# Patient Record
Sex: Female | Born: 1945 | Race: White | Hispanic: No | Marital: Married | Smoking: Former smoker
Health system: Southern US, Community
[De-identification: ages and names within clinical notes are randomized; demographics above are authoritative.]

## PROBLEM LIST (undated history)

## (undated) DIAGNOSIS — E785 Hyperlipidemia, unspecified: Secondary | ICD-10-CM

## (undated) DIAGNOSIS — C3490 Malignant neoplasm of unspecified part of unspecified bronchus or lung: Secondary | ICD-10-CM

## (undated) DIAGNOSIS — C801 Malignant (primary) neoplasm, unspecified: Secondary | ICD-10-CM

## (undated) DIAGNOSIS — M810 Age-related osteoporosis without current pathological fracture: Secondary | ICD-10-CM

## (undated) DIAGNOSIS — T7840XA Allergy, unspecified, initial encounter: Secondary | ICD-10-CM

## (undated) DIAGNOSIS — Z9981 Dependence on supplemental oxygen: Secondary | ICD-10-CM

## (undated) DIAGNOSIS — M40209 Unspecified kyphosis, site unspecified: Secondary | ICD-10-CM

## (undated) DIAGNOSIS — J449 Chronic obstructive pulmonary disease, unspecified: Secondary | ICD-10-CM

## (undated) DIAGNOSIS — E079 Disorder of thyroid, unspecified: Secondary | ICD-10-CM

## (undated) DIAGNOSIS — Z972 Presence of dental prosthetic device (complete) (partial): Secondary | ICD-10-CM

## (undated) DIAGNOSIS — Z923 Personal history of irradiation: Secondary | ICD-10-CM

## (undated) DIAGNOSIS — H9190 Unspecified hearing loss, unspecified ear: Secondary | ICD-10-CM

## (undated) DIAGNOSIS — N189 Chronic kidney disease, unspecified: Secondary | ICD-10-CM

## (undated) HISTORY — DX: Chronic obstructive pulmonary disease, unspecified: J44.9

## (undated) HISTORY — PX: SPINE SURGERY: SHX786

## (undated) HISTORY — PX: ABDOMINAL HYSTERECTOMY: SHX81

## (undated) HISTORY — PX: TONSILLECTOMY AND ADENOIDECTOMY: SUR1326

## (undated) HISTORY — DX: Allergy, unspecified, initial encounter: T78.40XA

## (undated) HISTORY — DX: Hyperlipidemia, unspecified: E78.5

## (undated) HISTORY — DX: Malignant (primary) neoplasm, unspecified: C80.1

## (undated) HISTORY — DX: Chronic kidney disease, unspecified: N18.9

## (undated) HISTORY — PX: APPENDECTOMY: SHX54

## (undated) HISTORY — PX: ROTATOR CUFF REPAIR: SHX139

## (undated) HISTORY — DX: Disorder of thyroid, unspecified: E07.9

## (undated) HISTORY — DX: Age-related osteoporosis without current pathological fracture: M81.0

## (undated) HISTORY — DX: Personal history of irradiation: Z92.3

## (undated) HISTORY — PX: KIDNEY STONE SURGERY: SHX686

## (undated) NOTE — *Deleted (*Deleted)
Jenny Howe presents today for follow-up of radiation (SBRT) to her right lung completed on 11/15/2018. She had chest CT earlier today and will be called by Dr. Roselind Messier with the results tomorrow  Pain: Fatigue: Appetite: *insert 3 weights* SOB: Cough: Other issues of note: F/U with pulmonologist?  *insert vitals*

---

## 1989-01-23 HISTORY — PX: NECK SURGERY: SHX720

## 2012-10-23 DIAGNOSIS — C3490 Malignant neoplasm of unspecified part of unspecified bronchus or lung: Secondary | ICD-10-CM

## 2012-10-23 HISTORY — DX: Malignant neoplasm of unspecified part of unspecified bronchus or lung: C34.90

## 2012-11-17 DIAGNOSIS — C801 Malignant (primary) neoplasm, unspecified: Secondary | ICD-10-CM

## 2012-11-17 HISTORY — DX: Malignant (primary) neoplasm, unspecified: C80.1

## 2012-11-17 HISTORY — PX: OTHER SURGICAL HISTORY: SHX169

## 2012-12-20 ENCOUNTER — Encounter: Payer: Self-pay | Admitting: *Deleted

## 2012-12-20 DIAGNOSIS — E079 Disorder of thyroid, unspecified: Secondary | ICD-10-CM | POA: Insufficient documentation

## 2012-12-20 DIAGNOSIS — N189 Chronic kidney disease, unspecified: Secondary | ICD-10-CM | POA: Insufficient documentation

## 2012-12-20 DIAGNOSIS — M810 Age-related osteoporosis without current pathological fracture: Secondary | ICD-10-CM | POA: Insufficient documentation

## 2012-12-20 DIAGNOSIS — J449 Chronic obstructive pulmonary disease, unspecified: Secondary | ICD-10-CM | POA: Insufficient documentation

## 2012-12-20 DIAGNOSIS — E785 Hyperlipidemia, unspecified: Secondary | ICD-10-CM | POA: Insufficient documentation

## 2012-12-20 DIAGNOSIS — T7840XA Allergy, unspecified, initial encounter: Secondary | ICD-10-CM | POA: Insufficient documentation

## 2012-12-21 ENCOUNTER — Encounter: Payer: Self-pay | Admitting: Surgery

## 2012-12-21 ENCOUNTER — Institutional Professional Consult (permissible substitution) (INDEPENDENT_AMBULATORY_CARE_PROVIDER_SITE_OTHER): Payer: Self-pay | Admitting: Surgery

## 2012-12-21 VITALS — BP 140/65 | HR 78 | Resp 20 | Ht 63.0 in | Wt 128.0 lb

## 2012-12-21 DIAGNOSIS — R911 Solitary pulmonary nodule: Secondary | ICD-10-CM

## 2012-12-24 ENCOUNTER — Encounter: Payer: Self-pay | Admitting: Surgery

## 2012-12-24 NOTE — Progress Notes (Signed)
[address]       [place] [address]             [phone]        PCP is CAMPBELL, Mila Homer., MD Referring Provider is Weston Settle, MD  Chief Complaint  Patient presents with  . Lung Lesion    Surgical eval on rt upper lobe lung nodule, Chest CT 10/20/12, PFT's 12/12/12    HPI:  The patient is a 67 year old woman with a long smoking history and severe emphysema who has been on oxygen at home for the past 6 years. She said that about 1/2-2 years ago she fell injuring her back and breaking some ribs and a CT scan the chest at that time on 04/29/2011 showed a small ill-defined right upper lobe lung density. She subsequently underwent a PET scan on 05/12/2011 which showed new bilateral lung densities that were hypermetabolic. It was felt that these may be inflammatory or infectious since they were ill-defined and progressed from one to multiple rapidly. She then underwent a repeat PET scan on 08/04/2011 that showed persistence of the irregular nodular density in the right upper lobe measuring 2.6 x 0.8 cm. It appeared slightly more solid than on the previous CT scan. The lesions on the left resolved. The SUV max at that time was 3.1. It was felt that this may be a resolving infectious or inflammatory process although bronchogenic carcinoma could not be ruled out. She underwent a followup CT scan the chest on 10/20/2012 which showed further progression of the right upper lobe irregular masslike opacity measuring 29 x 18 mm. 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 A CT scan of the brain was negative.  Past Medical History  Diagnosis Date  . COPD (chronic obstructive pulmonary disease)   . Osteoporosis   . Allergy     SEASONAL  . Thyroid disease     HYPOTHYROID  . Hyperlipidemia   . Chronic kidney disease     NEPHROLITHIASIS  . Cancer 11/17/12    RULobe.Marland KitchenMarland KitchenCT GUIDED BX     Past Surgical History  Procedure Laterality Date  . Kidney stone surgery      X 5 TO REMOVE STONES  . Rotator cuff repair      TWO RIGHT  . Neck surgery    . Spine surgery    . Abdominal hysterectomy    . Appendectomy    . Tonsillectomy and adenoidectomy    . Ct guided bx  11/17/12    RIGHT UPPER LOBE    Family History  Problem Relation Age of Onset  . Alzheimer's disease Mother   . Cancer Father     MESOTHELIOMA AND SUBDURAL HEMATOMAS  . Hypertension Sister   . Diabetes Sister   . Hypertension Sister   . Diabetes Sister     Social History History  Substance Use Topics  . Smoking status: Former Smoker -- 1.00 packs/day for 45 years    Types: Cigarettes   Quit smoking a few years ago.  . Smokeless tobacco: Not on file     Comment: CURRENTLY USES E-CIGS  . Alcohol Use: Not on file    Current Outpatient Prescriptions  Medication Sig Dispense Refill  . albuterol (PROVENTIL HFA;VENTOLIN HFA) 108 (90 BASE) MCG/ACT inhaler Inhale 2 puffs into the lungs daily as needed for wheezing.      Marland Kitchen albuterol (PROVENTIL) (2.5  MG/3ML) 0.083% nebulizer solution Take 2.5 mg by nebulization every 6 (six) hours as needed for wheezing.      Marland Kitchen atorvastatin (LIPITOR) 40 MG tablet Take 40 mg by mouth daily.      Marland Kitchen azelastine (ASTELIN) 137 MCG/SPRAY nasal spray Place 2 sprays into the nose daily. Use in each nostril as directed      . celecoxib (CELEBREX) 200 MG capsule Take 200 mg by mouth daily.      . cetirizine (ZYRTEC) 10 MG tablet Take 10 mg by mouth daily.      . Cholecalciferol (VITAMIN D) 2000 UNITS CAPS Take by mouth daily.      . diclofenac (FLECTOR) 1.3 % PTCH Place 1 patch onto the skin 2 (two) times daily.      Marland Kitchen gabapentin (NEURONTIN) 600 MG tablet Take 600 mg by mouth 2 (two) times daily.      Boris Lown Oil 500 MG CAPS Take by mouth daily.      Marland Kitchen levothyroxine (SYNTHROID, LEVOTHROID) 112 MCG tablet Take 112 mcg by mouth daily before breakfast.      . meclizine (ANTIVERT) 25 MG  tablet Take 25 mg by mouth 3 (three) times daily.      Marland Kitchen morphine (MSIR) 15 MG tablet Take 15 mg by mouth 2 (two) times daily.      Marland Kitchen oxyCODONE-acetaminophen (PERCOCET) 10-325 MG per tablet Take 1 tablet by mouth every 4 (four) hours as needed for pain.      . roflumilast (DALIRESP) 500 MCG TABS tablet Take 500 mcg by mouth daily.      . vitamin C (ASCORBIC ACID) 500 MG tablet Take 500 mg by mouth daily.      . vitamin E 1000 UNIT capsule Take 1,000 Units by mouth daily.        Home oxygen at 2.5 L for the past several years.  No current facility-administered medications for this visit.    Allergies  Allergen Reactions  . Penicillins Hives, Shortness Of Breath and Itching  . Procaine Hives, Shortness Of Breath and Itching    Novocaine  . Codeine Palpitations    "IRREGULAR HEARTBEAT"  . Ivp Dye (Iodinated Diagnostic Agents) Palpitations    "IRREGULAR HEARTBEAT"  . Povidone-Iodine (Povidone Iodine) Hives    BETADINE OR SOAPS CONTAINING BETADINE    Review of Systems  Constitutional: Positive for activity change, appetite change, fatigue and unexpected weight change. Negative for fever and chills.       Decreased appetite and 15-20 lb wt loss over the last 6-12 months.  HENT: Positive for hearing loss.        Wears dentures  Eyes: Negative.   Respiratory: Positive for cough and shortness of breath.   Cardiovascular: Negative for chest pain, palpitations and leg swelling.  Gastrointestinal: Positive for nausea.  Endocrine: Negative.   Genitourinary:       Kidney stones  Musculoskeletal: Positive for arthralgias. Negative for myalgias and joint swelling.       Lower back and legs  Skin: Negative.   Allergic/Immunologic: Negative.   Neurological: Negative.   Hematological: Negative.   Psychiatric/Behavioral: Negative.     BP 140/65  Pulse 78  Resp 20  Ht 5\' 3"  (1.6 m)  Wt 128 lb (58.06 kg)  BMI 22.68 kg/m2  SpO2 94% Physical Exam  Constitutional: She is oriented to  person, place, and time.  Chronically ill-appearing frail woman who looks older than her stated age. Wearing oxygen.  HENT:  Head: Normocephalic and atraumatic.  Mouth/Throat: Oropharynx is clear and moist.  Eyes: EOM are normal. Pupils are equal, round, and reactive to light.  Neck: No JVD present. No thyromegaly present.  Cardiovascular: Normal rate and regular rhythm.   Murmur heard. Pulmonary/Chest: Effort normal.  Decreased breath sounds throughout  Abdominal: Soft. Bowel sounds are normal. She exhibits no distension and no mass. There is no tenderness.  Musculoskeletal: She exhibits no edema.  kyphosis  Lymphadenopathy:    She has no cervical adenopathy.  Neurological: She is alert and oriented to person, place, and time. She has normal strength. No cranial nerve deficit or sensory deficit.  Skin: Skin is warm and dry.  Psychiatric: She has a normal mood and affect.     Diagnostic Tests:  All prior CT scans and PET scans were done at [facility] or [facility] and were reviewed by me on the [facility] Radiology site.  Pulmonary function testing from 12/12/2012 shows an FEV1 of 0.63 L which is 26% of predicted. There is no significant improvement with bronchodilators. FVC was 1.47 L which was 48% of predicted.   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 This is not amenable to a wedge resection and would require at least a right upper lobectomy and possibly a bilobectomy if this is extending across the fissure into the middle lobe. She has severe COPD with an FEV1 of 0.6 on home oxygen and is not a candidate for operative treatment. She is in overall poor general condition. I would consider referral to radiation oncology for possible radiation therapy. She may be a candidate for SBRT. I reviewed all the scans with the patient and her family and discussed all the above  with them.  Plan:  I'll discuss her with Dr. Melvyn Neth so that he can make referral to radiation oncology.

## 2013-01-04 ENCOUNTER — Encounter: Payer: Self-pay | Admitting: Radiation Oncology

## 2013-01-04 NOTE — Progress Notes (Addendum)
Thoracic Location of Tumor / Histology: RUL lung  Patient presented 24 months ago with symptoms of: CT chest s/p fall  Biopsies of RUL lung mass (if applicable) revealed: squamous cell carcinoma  Tobacco/Marijuana/Snuff/ETOH use: former smoker, 1 PPD x 45 yrs, cigarettes, quit a few yrs ago. Using E cigs at this time.  Past/Anticipated interventions by cardiothoracic surgery, if any: She has a 2.8 x 1.9 cm right upper lobe squamous cell carcinoma that is located along the fissure between the upper and middle lobe. This is not amenable to a wedge resection and would require at least a right upper lobectomy and possibly a bilobectomy if this is extending across the fissure into the middle lobe. She has severe COPD with an FEV1 of 0.6 on home oxygen and is not a candidate for operative treatment.   Past/Anticipated interventions by medical oncology, if any: size of lesion would not require adjuvant chemotherapy, per Dr DeQuincy's note 12/09/12  Signs/Symptoms  Weight changes, if any: decreased appetite, wgt loss 15-20 lbs over past 6-12 mos  Respiratory complaints, if any: cough, SOB, O2 dependent w/COPD @ 2.5L/min, mainly at night, when walking  Hemoptysis, if any: no  Pain issues, if any:  Chronic back pain  SAFETY ISSUES:  Prior radiation? no  Pacemaker/ICD? no  Possible current pregnancy? no  Is the patient on methotrexate? no  Current Complaints / other details:  Married, lives in [address], 3 children, 4 grandchildren, 11 great grandchildren, retired from [employer], [employer] work. Daughter lives next door. Pt c/o back pain which is chronic due to surgery for DDD w/screws, rods. Her pain medications are managed through pain clinic. She has dry cough, SOB w/exertion and uses O2 prn w/exertion and nightly. Pt states appetite increasing.

## 2013-01-05 ENCOUNTER — Encounter: Payer: Self-pay | Admitting: Radiation Oncology

## 2013-01-05 ENCOUNTER — Ambulatory Visit
Admission: RE | Admit: 2013-01-05 | Discharge: 2013-01-05 | Disposition: A | Discharge status: Home / Self Care | Payer: Medicare Other | Source: Ambulatory Visit | Attending: Radiation Oncology | Admitting: Radiation Oncology

## 2013-01-05 DIAGNOSIS — J438 Other emphysema: Secondary | ICD-10-CM | POA: Insufficient documentation

## 2013-01-05 DIAGNOSIS — M810 Age-related osteoporosis without current pathological fracture: Secondary | ICD-10-CM | POA: Insufficient documentation

## 2013-01-05 DIAGNOSIS — C3410 Malignant neoplasm of upper lobe, unspecified bronchus or lung: Secondary | ICD-10-CM | POA: Insufficient documentation

## 2013-01-05 DIAGNOSIS — Z9981 Dependence on supplemental oxygen: Secondary | ICD-10-CM | POA: Insufficient documentation

## 2013-01-05 DIAGNOSIS — Z79899 Other long term (current) drug therapy: Secondary | ICD-10-CM | POA: Insufficient documentation

## 2013-01-05 DIAGNOSIS — Z87891 Personal history of nicotine dependence: Secondary | ICD-10-CM | POA: Insufficient documentation

## 2013-01-05 HISTORY — DX: Malignant neoplasm of unspecified part of unspecified bronchus or lung: C34.90

## 2013-01-05 HISTORY — DX: Unspecified hearing loss, unspecified ear: H91.90

## 2013-01-05 HISTORY — DX: Unspecified kyphosis, site unspecified: M40.209

## 2013-01-05 HISTORY — DX: Dependence on supplemental oxygen: Z99.81

## 2013-01-05 HISTORY — DX: Presence of dental prosthetic device (complete) (partial): Z97.2

## 2013-01-05 NOTE — Progress Notes (Signed)
Pt circled 10/10 on distress screen. Her most distressing problem is financial. Informed pt and daughter that this RN will call SW and infomed them of her score today, and if SW available she will see pt today. Otherwise SW will call pt. Also informed pt that when she has her planning session for radiation treatment she will see financial counselor. Pt verbalized understanding. Pt states she has insurance but "it won't pay everything".  Called Lauren Mullis SW and left vm w/above information.

## 2013-01-05 NOTE — Progress Notes (Signed)
Please see the Nurse Progress Note in the MD Initial Consult Encounter for this patient. 

## 2013-01-05 NOTE — Progress Notes (Signed)
Radiation Oncology         (336) [phone] ________________________________  Initial outpatient Consultation  Name: Jenny Howe MRN: [id number]  Date: 01/05/2013  DOB: 1945/10/28  WU:[id number], Jenny Howe., MD  Weston Settle, MD , Marcellus Scott, MD, Dorris Singh, MD  REFERRING PHYSICIAN: Weston Settle, MD  DIAGNOSIS: Clinical stage I non-small cell lung cancer  HISTORY OF PRESENT ILLNESS::Jenny Howe is a 68 y.o. female who is seen out of the courtesy of Dr. Rennis Harding for an opinion concerning radiation therapy as part of management of patient's recently diagnosed non-small cell lung cancer. The patient presented with an enlarging lesion in the right upper lobe.. A biopsy of this area was performed on 11/17/2012 showing squamous cell carcinoma. The patient's chest CT scan showed no other areas of involvement.  The patient does have a history of severe emphysema and has been oxygen dependent for the past 6 years. She uses her oxygen (2.5 L)with exertion and sleeping. the patient did see Dr. Laneta Simmers for consideration for surgery of this area. The location of the lesion likely require a right upper lobe lobectomy and possibly bilobectomy. With the patient's FEV1 of 0.6 she was not felt to be a candidate for surgical intervention. With this information the patient is now seen in radiation oncology be considered for definitive treatment.  She did undergo a CT scan of the head showing no metastatic disease to this region.    PREVIOUS RADIATION THERAPY: No  PAST MEDICAL HISTORY:  has a past medical history of COPD (chronic obstructive pulmonary disease); Osteoporosis; Allergy; Thyroid disease; Hyperlipidemia; Chronic kidney disease; Cancer (11/17/12); Lung cancer (10/2012); Kyphosis; Oxygen dependent; Hearing loss; Wears dentures; and Osteoporosis.    PAST SURGICAL HISTORY: Past Surgical History  Procedure Laterality Date  . Kidney stone surgery      X 5 TO REMOVE STONES  . Rotator cuff  repair      TWO RIGHT  . Neck surgery  1990's    DDD, replaced disk  . Spine surgery      DDD, screws and rods  . Abdominal hysterectomy    . Appendectomy    . Tonsillectomy and adenoidectomy    . Ct guided bx  11/17/12    RIGHT UPPER LOBE    FAMILY HISTORY: family history includes Alzheimer's disease in her mother; Cancer in her father; Diabetes in her sister and sister; Hypertension in her sister and sister.  SOCIAL HISTORY:  reports that she has quit smoking. Her smoking use included Cigarettes. She has a 45 pack-year smoking history. She does not have any smokeless tobacco history on file. She reports that she does not drink alcohol or use illicit drugs.  ALLERGIES: Penicillins; Procaine; Codeine; Ivp dye; and Povidone-iodine  MEDICATIONS:  Current Outpatient Prescriptions  Medication Sig Dispense Refill  . albuterol (PROVENTIL HFA;VENTOLIN HFA) 108 (90 BASE) MCG/ACT inhaler Inhale 2 puffs into the lungs daily as needed for wheezing.      Marland Kitchen albuterol (PROVENTIL) (2.5 MG/3ML) 0.083% nebulizer solution Take 2.5 mg by nebulization every 6 (six) hours as needed for wheezing.      Marland Kitchen atorvastatin (LIPITOR) 40 MG tablet Take 40 mg by mouth daily.      Marland Kitchen azelastine (ASTELIN) 137 MCG/SPRAY nasal spray Place 2 sprays into the nose daily. Use in each nostril as directed      . celecoxib (CELEBREX) 200 MG capsule Take 200 mg by mouth daily.      . cetirizine (ZYRTEC) 10 MG tablet Take 10  mg by mouth daily.      . Cholecalciferol (VITAMIN D) 2000 UNITS CAPS Take by mouth daily.      . diclofenac (FLECTOR) 1.3 % PTCH Place 1 patch onto the skin 2 (two) times daily.      Marland Kitchen gabapentin (NEURONTIN) 600 MG tablet Take 600 mg by mouth 2 (two) times daily.      Boris Lown Oil 500 MG CAPS Take by mouth daily.      Marland Kitchen levothyroxine (SYNTHROID, LEVOTHROID) 112 MCG tablet Take 112 mcg by mouth daily before breakfast.      . meclizine (ANTIVERT) 25 MG tablet Take 25 mg by mouth 3 (three) times daily.      Marland Kitchen  morphine (MSIR) 15 MG tablet Take 15 mg by mouth 2 (two) times daily.      Marland Kitchen oxyCODONE-acetaminophen (PERCOCET) 10-325 MG per tablet Take 1 tablet by mouth every 4 (four) hours as needed for pain.      . roflumilast (DALIRESP) 500 MCG TABS tablet Take 500 mcg by mouth daily.      . vitamin C (ASCORBIC ACID) 500 MG tablet Take 500 mg by mouth daily.      . vitamin E 1000 UNIT capsule Take 1,000 Units by mouth daily.       No current facility-administered medications for this encounter.    REVIEW OF SYSTEMS:  A 15 point review of systems is documented in the electronic medical record. This was obtained by the nursing staff. However, I reviewed this with the patient to discuss relevant findings and make appropriate changes. She denies any pain in the chest area or significant cough. She denies any hemoptysis. She has dyspnea with exertion. She is able to walk out to her mailbox before becoming too short of breath. She denies any headaches dizziness or blurred vision. She has chronic back pain requiring narcotics.    PHYSICAL EXAM:  height is 5\' 3"  (1.6 m) and weight is 127 lb 11.2 oz (57.924 kg). Her oral temperature is 99.1 F (37.3 C). Her blood pressure is 160/56 and her pulse is 77. Her respiration is 20 and oxygen saturation is 96%.   BP 160/56  Pulse 77  Temp(Src) 99.1 F (37.3 C) (Oral)  Resp 20  Ht 5\' 3"  (1.6 m)  Wt 127 lb 11.2 oz (57.924 kg)  BMI 22.63 kg/m2  SpO2 96%  General Appearance:    Alert, cooperative, no distress, appears stated age,  she is accompanied by 2 of her daughters this afternoon   Head:    Normocephalic, without obvious abnormality, atraumatic  Eyes:    PERRL, conjunctiva/corneas clear, EOM's intact,        Nose:   Nares normal, septum midline, mucosa normal, no drainage    or sinus tenderness  Throat:   Lips, mucosa, and tongue normal; gums normal, dentures in place   Neck:   Supple, symmetrical, trachea midline, no adenopathy;    thyroid:  no  enlargement/tenderness/nodules;      Back:     Symmetric, no curvature, ROM normal, no CVA tenderness  Lungs:     Clear to auscultation bilaterally, respirations unlabored, breath sounds in general distant   Chest Wall:    No tenderness or deformity   Heart:    Regular rate and rhythm,        Abdomen:     Soft, non-tender, bowel sounds active all four quadrants,    no masses, no organomegaly, scar along the right lateral abdominal region from  prior kidney surgery         Extremities:   Extremities normal, atraumatic, no cyanosis or edema, some clubbing of fingernails noted  Pulses:   2+ and symmetric all extremities  Skin:   Skin color, texture, turgor normal, no rashes or lesions  Lymph nodes:   Cervical, supraclavicular, and axillary nodes normal  Neurologic:    normal strength,        KPS = 60-70  100 - Normal; no complaints; no evidence of disease. 90   - Able to carry on normal activity; minor signs or symptoms of disease. 80   - Normal activity with effort; some signs or symptoms of disease. 65   - Cares for self; unable to carry on normal activity or to do active work. 60   - Requires occasional assistance, but is able to care for most of his personal needs. 50   - Requires considerable assistance and frequent medical care. 40   - Disabled; requires special care and assistance. 30   - Severely disabled; hospital admission is indicated although death not imminent. 20   - Very sick; hospital admission necessary; active supportive treatment necessary. 10   - Moribund; fatal processes progressing rapidly. 0     - Dead  Karnofsky DA, Abelmann WH, Craver LS and Burchenal JH [phone]) The use of the nitrogen mustards in the palliative treatment of carcinoma: with particular reference to bronchogenic carcinoma Cancer 1 634-56  LABORATORY DATA:  No results found for this basename: WBC, HGB, HCT, MCV, PLT   No results found for this basename: NA, K, CL, CO2   No results found for  this basename: ALT, AST, GGT, ALKPHOS, BILITOT     RADIOGRAPHY:  outside images reviewed     IMPRESSION: Clinical stage I squamous cell carcinoma  of the lung presenting in the right upper lobe region. Patient is not a candidate for surgery in light of her severe emphysema and medical condition.   she would be at good candidate for a definitive course of radiation therapy.  After careful review of the patient's images she would appear to be a candidate for stereotactic body radiotherapy (SBRT).   in terms of her radiation therapy options this would give the best chances for cure while at the same time reducing the amount of scar tissue of her normal lung. I discussed the treatment course side effects and potential toxicities of radiation therapy in this situation with the patient and her family. She does understand that her breathing could potentially worsen after this treatment but she also understands that her breathing  would worsen with an untreated tumor.   she is willing to accept the risks of radiation therapy and proceed with a definitive course of treatment.   PLAN: simulation and planning on 01/10/2013  I spent 60 minutes minutes face to face with the patient and more than 50% of that time was spent in counseling and/or coordination of care.   ------------------------------------------------  -----------------------------------  Billie Lade, PhD, MD

## 2013-01-10 ENCOUNTER — Encounter: Payer: Self-pay | Admitting: Radiation Oncology

## 2013-01-10 ENCOUNTER — Ambulatory Visit
Admission: RE | Admit: 2013-01-10 | Discharge: 2013-01-10 | Disposition: A | Discharge status: Home / Self Care | Payer: Medicare Other | Source: Ambulatory Visit | Attending: Radiation Oncology | Admitting: Radiation Oncology

## 2013-01-10 DIAGNOSIS — Z510 Encounter for antineoplastic radiation therapy: Secondary | ICD-10-CM | POA: Insufficient documentation

## 2013-01-10 DIAGNOSIS — C3490 Malignant neoplasm of unspecified part of unspecified bronchus or lung: Secondary | ICD-10-CM | POA: Insufficient documentation

## 2013-01-10 DIAGNOSIS — J3489 Other specified disorders of nose and nasal sinuses: Secondary | ICD-10-CM | POA: Insufficient documentation

## 2013-01-10 NOTE — Progress Notes (Signed)
  Radiation Oncology         [phone]) [phone] ________________________________  Name: Jenny Howe MRN: [id number]  Date: 01/10/2013  DOB: [birth year]  RESPIRATORY MOTION MANAGEMENT SIMULATION  NARRATIVE:  In order to account for effect of respiratory motion on target structures and other organs in the planning and delivery of radiotherapy, this patient underwent respiratory motion management simulation.  To accomplish this, when the patient was brought to the CT simulation planning suite, 4D respiratoy motion management CT images were obtained.  The CT images were loaded into the planning software.  Then, using a variety of tools including Cine, MIP, and standard views, the target volume and planning target volumes (PTV) were delineated.  Avoidance structures were contoured.  Treatment planning then occurred.  Dose volume histograms were generated and reviewed for each of the requested structure.  The resulting plan was carefully reviewed and approved today.  -----------------------------------  Billie Lade, PhD, MD

## 2013-01-10 NOTE — Progress Notes (Signed)
Patient had questions about insurance and ability to pay.  Gave her information regarding [employer].  Patient has applied for MCD and has bills to give them for deductible.

## 2013-01-10 NOTE — Progress Notes (Signed)
  Radiation Oncology         ([phone]) [phone] ________________________________  Name: Jenny Howe MRN: [id number]  Date: 01/10/2013  DOB: [birth year]  SIMULATION AND TREATMENT PLANNING NOTE  DIAGNOSIS: Clinical stage I non-small cell lung cancer  NARRATIVE:  The patient was brought to the CT Simulation planning suite.  Identity was confirmed.  All relevant records and images related to the planned course of therapy were reviewed.  The patient freely provided informed written consent to proceed with treatment after reviewing the details related to the planned course of therapy. The consent form was witnessed and verified by the simulation staff.  Then, the patient was set-up in a stable reproducible  supine position for radiation therapy.  CT images were obtained.  Surface markings were placed.  The CT images were loaded into the planning software.  Then the target and avoidance structures were contoured.  Treatment planning then occurred.  The radiation prescription was entered and confirmed.  Then, I designed and supervised the construction of a total of 2 medically necessary complex treatment devices.  I have requested : This treatment constitutes a Special Treatment Procedure for the following reason: [ High dose per fraction requiring special monitoring for increased toxicities of treatment including daily imaging..  I have ordered: 3-D simulation with planning for stereotactic body radiation therapy.  PLAN:  The patient will receive 54 Gy in 3 fractions.  ________________________________  -----------------------------------  Billie Lade, PhD, MD

## 2013-01-25 ENCOUNTER — Ambulatory Visit
Admission: RE | Admit: 2013-01-25 | Discharge: 2013-01-25 | Disposition: A | Discharge status: Home / Self Care | Payer: Medicare Other | Source: Ambulatory Visit | Attending: Radiation Oncology | Admitting: Radiation Oncology

## 2013-01-25 ENCOUNTER — Encounter: Payer: Self-pay | Admitting: Radiation Oncology

## 2013-01-25 NOTE — Progress Notes (Signed)
  Radiation Oncology         [phone] ________________________________  Name: Jenny Howe MRN: [id number]  Date: 01/25/2013  DOB: Sep 14, 1945  Stereotactic Body Radiotherapy Treatment Procedure Note #1   NARRATIVE:  Jenny Howe was brought to the stereotactic radiation treatment machine and placed supine on the CT couch. The patient was set up for stereotactic body radiotherapy on the body fix pillow.  3D TREATMENT PLANNING AND DOSIMETRY:  The patient's radiation plan was reviewed and approved prior to starting treatment.  It showed 3-dimensional radiation distributions overlaid onto the planning CT.  The [facility] for the target structures as well as the organs at risk were reviewed. The documentation of this is filed in the radiation oncology EMR.  SIMULATION VERIFICATION:  The patient underwent CT imaging on the treatment unit.  These were carefully aligned to document that the ablative radiation dose would cover the target volume and maximally spare the nearby organs at risk according to the planned distribution.  SPECIAL TREATMENT PROCEDURE: Jenny Howe received high dose ablative stereotactic body radiotherapy to the planned target volume without unforeseen complications. Treatment was delivered uneventfully. The high doses associated with stereotactic body radiotherapy and the significant potential risks require careful treatment set up and patient monitoring constituting a special treatment procedure   STEREOTACTIC TREATMENT MANAGEMENT:  Following delivery, the patient was evaluated clinically. The patient tolerated treatment without significant acute effects, and was discharged to home in stable condition.    PLAN: Continue treatment as planned.  ________________________________  Billie Lade, PhD, MD

## 2013-01-25 NOTE — Progress Notes (Signed)
[facility]    Radiation Oncology [id number] [place]     Maryln Gottron, M.D. [place], [place] [address]               Billie Lade, M.D., Ph.D. Phone: [phone]      Molli Hazard A. Kathrynn Running, M.D. Fax: [phone]      Radene Gunning, M.D., Ph.D.         Lurline Hare, M.D.         Grayland Jack, M.D Weekly Treatment Management Note  Name: Jenny Howe     MRN: [id number]        CSN: [id number] Date: 01/25/2013      DOB: [birth year]  CC: Wilmer Floor., MD         Orvan Falconer    Status: Outpatient  Diagnosis: The encounter diagnosis was Lung cancer, right.  Current Dose: 18 Gy  Current Fraction: 1  Planned Dose: 54 Gy  Narrative: Jenny Howe was seen today for weekly treatment management. The chart was checked and CBCT  were reviewed. sHe is tolerating her SBRT well at this time without any side effects. She has had sinus congestion and will see her primary care physician concerning this issue.  Penicillins; Procaine; Codeine; Ivp dye; and Povidone-iodine  Current Outpatient Prescriptions  Medication Sig Dispense Refill  . albuterol (PROVENTIL HFA;VENTOLIN HFA) 108 (90 BASE) MCG/ACT inhaler Inhale 2 puffs into the lungs daily as needed for wheezing.      Marland Kitchen albuterol (PROVENTIL) (2.5 MG/3ML) 0.083% nebulizer solution Take 2.5 mg by nebulization every 6 (six) hours as needed for wheezing.      Marland Kitchen atorvastatin (LIPITOR) 40 MG tablet Take 40 mg by mouth daily.      Marland Kitchen azelastine (ASTELIN) 137 MCG/SPRAY nasal spray Place 2 sprays into the nose daily. Use in each nostril as directed      . celecoxib (CELEBREX) 200 MG capsule Take 200 mg by mouth daily.      . cetirizine (ZYRTEC) 10 MG tablet Take 10 mg by mouth daily.      . Cholecalciferol (VITAMIN D) 2000 UNITS CAPS Take by mouth daily.      . diclofenac (FLECTOR) 1.3 % PTCH Place 1 patch onto the skin 2 (two) times daily.      Marland Kitchen gabapentin (NEURONTIN) 600 MG tablet Take 600 mg by mouth 2 (two) times daily.       Boris Lown Oil 500 MG CAPS Take by mouth daily.      Marland Kitchen levothyroxine (SYNTHROID, LEVOTHROID) 112 MCG tablet Take 112 mcg by mouth daily before breakfast.      . meclizine (ANTIVERT) 25 MG tablet Take 25 mg by mouth 3 (three) times daily.      Marland Kitchen morphine (MSIR) 15 MG tablet Take 15 mg by mouth 2 (two) times daily.      Marland Kitchen oxyCODONE-acetaminophen (PERCOCET) 10-325 MG per tablet Take 1 tablet by mouth every 4 (four) hours as needed for pain.      . roflumilast (DALIRESP) 500 MCG TABS tablet Take 500 mcg by mouth daily.      . vitamin C (ASCORBIC ACID) 500 MG tablet Take 500 mg by mouth daily.      . vitamin E 1000 UNIT capsule Take 1,000 Units by mouth daily.       No current facility-administered medications for this encounter.   Labs:  No results found for this basename: WBC, HGB, HCT, MCV, PLT   No results found for this  basename: CREATININE, BUN, NA, K, CL, CO2   No results found for this basename: ALT, AST, GGT, ALK, PHOS, BILITOT    Physical Examination:  weight is 127 lb 11.2 oz (57.924 kg). Her oral temperature is 98.6 F (37 C). Her blood pressure is 138/55 and her pulse is 76. Her respiration is 20 and oxygen saturation is 95%.    Wt Readings from Last 3 Encounters:  01/25/13 127 lb 11.2 oz (57.924 kg)  01/05/13 127 lb 11.2 oz (57.924 kg)  12/21/12 128 lb (58.06 kg)     Lungs - Normal respiratory effort, chest expands symmetrically. Lungs are clear to auscultation, no crackles or wheezes.  Heart has regular rhythm and rate  Abdomen is soft and non tender with normal bowel sounds  Assessment:  Patient tolerating treatments well  Plan: Continue treatment per original radiation prescription

## 2013-01-25 NOTE — Progress Notes (Signed)
Pt c/o back pain which is chronic. Pt denies other pain today, has dry cough, SOB w/minimal exertion. She uses O2 nightly and prn. Pt c/o "sinus infection" x 2 weeks. She is taking OTC Zyrtec daily. She states she has headache, sinus pressure, fullness in her forehead, above her nose and under her eyes. She states her PCP usually gives her antibiotic. Pt will discuss w/dr today. Pt fatigued.

## 2013-01-27 ENCOUNTER — Ambulatory Visit: Payer: Medicare Other | Admitting: Radiation Oncology

## 2013-01-30 ENCOUNTER — Ambulatory Visit
Admission: RE | Admit: 2013-01-30 | Discharge: 2013-01-30 | Disposition: A | Discharge status: Home / Self Care | Payer: Medicare Other | Source: Ambulatory Visit | Attending: Radiation Oncology | Admitting: Radiation Oncology

## 2013-01-30 NOTE — Progress Notes (Signed)
  Radiation Oncology         [phone] ________________________________  Name: Jenny Howe MRN: [id number]  Date: 01/30/2013  DOB: 01-12-1946  Stereotactic Body Radiotherapy Treatment Procedure Note #2  NARRATIVE:  Jenny Howe was brought to the stereotactic radiation treatment machine and placed supine on the CT couch. The patient was set up for stereotactic body radiotherapy on the body fix pillow.  3D TREATMENT PLANNING AND DOSIMETRY:  The patient's radiation plan was reviewed and approved prior to starting treatment.  It showed 3-dimensional radiation distributions overlaid onto the planning CT.  The [facility] for the target structures as well as the organs at risk were reviewed. The documentation of this is filed in the radiation oncology EMR.  SIMULATION VERIFICATION:  The patient underwent CT imaging on the treatment unit.  These were carefully aligned to document that the ablative radiation dose would cover the target volume and maximally spare the nearby organs at risk according to the planned distribution.  SPECIAL TREATMENT PROCEDURE: Jenny Howe received high dose ablative stereotactic body radiotherapy to the planned target volume without unforeseen complications. Treatment was delivered uneventfully. The high doses associated with stereotactic body radiotherapy and the significant potential risks require careful treatment set up and patient monitoring constituting a special treatment procedure   STEREOTACTIC TREATMENT MANAGEMENT:  Following delivery, the patient was evaluated clinically. The patient tolerated treatment without significant acute effects, and was discharged to home in stable condition.    PLAN: Continue treatment as planned.  ________________________________  Billie Lade, PhD, MD

## 2013-02-01 ENCOUNTER — Ambulatory Visit: Payer: Medicare Other | Admitting: Radiation Oncology

## 2013-02-02 ENCOUNTER — Ambulatory Visit
Admission: RE | Admit: 2013-02-02 | Discharge: 2013-02-02 | Disposition: A | Discharge status: Home / Self Care | Payer: Medicare Other | Source: Ambulatory Visit | Attending: Radiation Oncology | Admitting: Radiation Oncology

## 2013-02-02 NOTE — Progress Notes (Signed)
  Radiation Oncology         [phone] ________________________________  Name: Jenny Howe MRN: [id number]  Date: 02/02/2013  DOB: [birth year]  Stereotactic Body Radiotherapy Treatment Procedure Note- #3  NARRATIVE:  Jenny Howe was brought to the stereotactic radiation treatment machine and placed supine on the CT couch. The patient was set up for stereotactic body radiotherapy on the body fix pillow.  3D TREATMENT PLANNING AND DOSIMETRY:  The patient's radiation plan was reviewed and approved prior to starting treatment.  It showed 3-dimensional radiation distributions overlaid onto the planning CT.  The [facility] for the target structures as well as the organs at risk were reviewed. The documentation of this is filed in the radiation oncology EMR.  SIMULATION VERIFICATION:  The patient underwent CT imaging on the treatment unit.  These were carefully aligned to document that the ablative radiation dose would cover the target volume and maximally spare the nearby organs at risk according to the planned distribution.  SPECIAL TREATMENT PROCEDURE: Jenny Howe received high dose ablative stereotactic body radiotherapy to the planned target volume without unforeseen complications. Treatment was delivered uneventfully. The high doses associated with stereotactic body radiotherapy and the significant potential risks require careful treatment set up and patient monitoring constituting a special treatment procedure   STEREOTACTIC TREATMENT MANAGEMENT:  Following delivery, the patient was evaluated clinically. The patient tolerated treatment without significant acute effects, and was discharged to home in stable condition.    PLAN: Continue treatment as planned.  ________________________________  Billie Lade, PhD, MD

## 2013-02-03 ENCOUNTER — Ambulatory Visit: Payer: Medicare Other | Admitting: Radiation Oncology

## 2013-02-24 ENCOUNTER — Encounter: Payer: Self-pay | Admitting: Radiation Oncology

## 2013-02-24 NOTE — Progress Notes (Signed)
  Radiation Oncology         [phone] ________________________________  Name: Shaneice Barsanti MRN: [id number]  Date: 02/24/2013  DOB: [birth year]  End of Treatment Note  Diagnosis:   Clinical stage I non-small cell lung cancer     Indication for treatment:  Definitive treatment       Radiation treatment dates:   September 3, September 8, September 11  Site/dose:   Right upper lobe nodule, 54 gray in 3 fractions  Beams/energy:   SBRT techniques  Narrative: The patient tolerated radiation treatment relatively well.   She had some mild fatigue with her therapy.  Plan: The patient has completed radiation treatment. The patient will return to radiation oncology clinic for routine followup in one month. I advised them to call or return sooner if they have any questions or concerns related to their recovery or treatment.  -----------------------------------  Billie Lade, PhD, MD

## 2013-02-28 ENCOUNTER — Encounter: Payer: Self-pay | Admitting: Oncology

## 2013-03-02 ENCOUNTER — Encounter: Payer: Self-pay | Admitting: Radiation Oncology

## 2013-03-02 ENCOUNTER — Ambulatory Visit
Admission: RE | Admit: 2013-03-02 | Discharge: 2013-03-02 | Disposition: A | Discharge status: Home / Self Care | Payer: Medicare Other | Source: Ambulatory Visit | Attending: Radiation Oncology | Admitting: Radiation Oncology

## 2013-03-02 NOTE — Progress Notes (Addendum)
Jenny Howe's here for follow up with her husband and daughter.  She is having pain in her lower back, pelvis and legs.  She said it is sciatica and it rating it at a 8/10.  She is taking morphine and percocet for this.  She says her shortness of breath with activity is a little worse since treatment ended.  Her oxygen saturation today on 2L is 95%.  She denies a cough or hemoptysis.  She was recently on antibiotics and prednisone for a sinus infection and bronchitis.  She finished the meds 2 days ago.  She has a strip of redness on her right side/right chest.  She does have fatigue.

## 2013-03-02 NOTE — Progress Notes (Signed)
Radiation Oncology         [phone]) [phone] ________________________________  Name: Jenny Howe MRN: [id number]  Date: 03/02/2013  DOB: 08/03/1945  Follow-Up Visit Note  CC: CAMPBELL, Mila Homer., MD  Weston Settle, MD  Diagnosis:   Clinical stage I non-small cell lung cancer  Interval Since Last Radiation:  One month ,  the patient completed SBRT  For a early stage I non-small cell lung cancer presenting in the right upper lobe  Narrative:  The patient returns today for routine follow-up.  She has had some fatigue but admits to recently completing antibiotic therapy for sinus infection. Her breathing is stable. She was given steroids for wheezing at the same time as her antibiotic therapy by her pulmonologist.  She denies any significant coughing or hemoptysis or pain in the chest area.                              ALLERGIES:  is allergic to penicillins; procaine; codeine; ivp dye; and povidone-iodine.  Meds: Current Outpatient Prescriptions  Medication Sig Dispense Refill  . albuterol (PROVENTIL HFA;VENTOLIN HFA) 108 (90 BASE) MCG/ACT inhaler Inhale 2 puffs into the lungs daily as needed for wheezing.      Marland Kitchen albuterol (PROVENTIL) (2.5 MG/3ML) 0.083% nebulizer solution Take 2.5 mg by nebulization every 6 (six) hours as needed for wheezing.      Marland Kitchen atorvastatin (LIPITOR) 40 MG tablet Take 40 mg by mouth daily.      Marland Kitchen azelastine (ASTELIN) 137 MCG/SPRAY nasal spray Place 2 sprays into the nose daily. Use in each nostril as directed      . cetirizine (ZYRTEC) 10 MG tablet Take 10 mg by mouth daily.      . Cholecalciferol (VITAMIN D) 2000 UNITS CAPS Take by mouth daily.      Marland Kitchen gabapentin (NEURONTIN) 600 MG tablet Take 600 mg by mouth 2 (two) times daily.      Boris Lown Oil 500 MG CAPS Take by mouth daily.      Marland Kitchen levothyroxine (SYNTHROID, LEVOTHROID) 112 MCG tablet Take 112 mcg by mouth daily before breakfast.      . meclizine (ANTIVERT) 25 MG tablet Take 25 mg by mouth 3 (three) times daily.       Marland Kitchen morphine (MSIR) 15 MG tablet Take 15 mg by mouth 2 (two) times daily.      Marland Kitchen oxyCODONE-acetaminophen (PERCOCET) 10-325 MG per tablet Take 1 tablet by mouth every 4 (four) hours as needed for pain.      . roflumilast (DALIRESP) 500 MCG TABS tablet Take 500 mcg by mouth daily.      . vitamin C (ASCORBIC ACID) 500 MG tablet Take 500 mg by mouth daily.      . vitamin E 1000 UNIT capsule Take 1,000 Units by mouth daily.      . celecoxib (CELEBREX) 200 MG capsule Take 200 mg by mouth daily.      . diclofenac (FLECTOR) 1.3 % PTCH Place 1 patch onto the skin 2 (two) times daily.       No current facility-administered medications for this encounter.    Physical Findings: The patient is in no acute distress. Patient is alert and oriented.  height is 5\' 3"  (1.6 m) and weight is 133 lb 12.8 oz (60.691 kg). Her temperature is 98.8 F (37.1 C). Her blood pressure is 133/58 and her pulse is 93. Her oxygen saturation is 95%. .  The  lungs are clear. The heart has a regular rhythm and rate. The patient has 2 L of supplemental oxygen in place.  Lab Findings: No results found for this basename: WBC, HGB, HCT, MCV, PLT      Radiographic Findings: No results found.  Impression:  Clinically stable status post SBRT for early stage lung cancer  Plan:  Routine followup in 3 months. The patient will be set up for a chest CT scan at that time if not already performed by another provider.  _____________________________________  -----------------------------------  Billie Lade, PhD, MD

## 2013-06-05 ENCOUNTER — Ambulatory Visit
Admission: RE | Admit: 2013-06-05 | Discharge: 2013-06-05 | Disposition: A | Discharge status: Home / Self Care | Payer: Medicare Other | Source: Ambulatory Visit | Attending: Radiation Oncology | Admitting: Radiation Oncology

## 2013-06-05 ENCOUNTER — Telehealth: Payer: Self-pay | Admitting: *Deleted

## 2013-06-05 ENCOUNTER — Encounter: Payer: Self-pay | Admitting: Radiation Oncology

## 2013-06-05 NOTE — Progress Notes (Signed)
Radiation Oncology         [phone]) [phone] ________________________________  Name: Jenny Howe MRN: [id number]  Date: 06/05/2013  DOB: 1946/03/16  Follow-Up Visit Note  CC: CAMPBELL, Estill Bamberg., MD  [name], MD  Diagnosis:   Clinical stage I non-small cell lung cancer presenting in the right upper lobe  Interval Since Last Radiation:  4  months  Narrative:  The patient returns today for routine follow-up.  She has been doing reasonably well up until recently. She was diagnosed with bronchitis late last week when placed on antibiotics for this issue. She is doing better at this time concerning this issue.   she denies any hemoptysis or pain in the chest area. She denies any new bony pain headaches dizziness or blurred vision.  She uses oxygen when ambulating and at night.                            ALLERGIES:  is allergic to penicillins; procaine; codeine; ivp dye; and povidone-iodine.  Meds: Current Outpatient Prescriptions  Medication Sig Dispense Refill  . albuterol (PROVENTIL HFA;VENTOLIN HFA) 108 (90 BASE) MCG/ACT inhaler Inhale 2 puffs into the lungs daily as needed for wheezing.      Marland Kitchen albuterol (PROVENTIL) (2.5 MG/3ML) 0.083% nebulizer solution Take 2.5 mg by nebulization every 6 (six) hours as needed for wheezing.      Marland Kitchen atorvastatin (LIPITOR) 40 MG tablet Take 40 mg by mouth daily.      Marland Kitchen azelastine (ASTELIN) 137 MCG/SPRAY nasal spray Place 2 sprays into the nose daily. Use in each nostril as directed      . cetirizine (ZYRTEC) 10 MG tablet Take 10 mg by mouth daily.      . Cholecalciferol (VITAMIN D) 2000 UNITS CAPS Take by mouth daily.      . diclofenac (FLECTOR) 1.3 % PTCH Place 1 patch onto the skin 2 (two) times daily.      Marland Kitchen gabapentin (NEURONTIN) 600 MG tablet Take 600 mg by mouth 2 (two) times daily.      Javier Docker Oil 500 MG CAPS Take by mouth daily.      Marland Kitchen levofloxacin (LEVAQUIN) 750 MG tablet Take 750 mg by mouth daily. For 10 days      . levothyroxine  (SYNTHROID, LEVOTHROID) 112 MCG tablet Take 112 mcg by mouth daily before breakfast.      . loperamide (IMODIUM) 2 MG capsule Take by mouth as needed for diarrhea or loose stools.      . meclizine (ANTIVERT) 25 MG tablet Take 25 mg by mouth 3 (three) times daily.      Marland Kitchen morphine (MSIR) 15 MG tablet Take 15 mg by mouth 2 (two) times daily.      Marland Kitchen oxyCODONE-acetaminophen (PERCOCET) 10-325 MG per tablet Take 1 tablet by mouth every 4 (four) hours as needed for pain.      . Probiotic Product (SOLUBLE FIBER/PROBIOTICS PO) Take 1 tablet by mouth daily.      . roflumilast (DALIRESP) 500 MCG TABS tablet Take 500 mcg by mouth daily.      . vitamin C (ASCORBIC ACID) 500 MG tablet Take 500 mg by mouth daily.      . vitamin E 1000 UNIT capsule Take 1,000 Units by mouth daily.       No current facility-administered medications for this encounter.    Physical Findings: The patient is in no acute distress. Patient is alert and oriented.  weight is 129 lb 3.2 oz (58.605 kg). Her oral temperature is 98.6 F (37 C). Her blood pressure is 133/63 and her pulse is 98. Her respiration is 22 and oxygen saturation is 94%. .  No palpable supraclavicular or axillary adenopathy. The lungs are clear to auscultation. The heart has a regular rhythm and rate.  Lab Findings: No results found for this basename: WBC, HGB, HCT, MCV, PLT      Radiographic Findings: No results found.  Impression:  Clinically stable    Plan:  She will be scheduled for her first CT scan status post SBRT routine followup in 6 months. The scan will be performed at the [facility] facility.  ____________________________________ Blair Promise, MD

## 2013-06-05 NOTE — Telephone Encounter (Signed)
Called patient to inform of test, lvm for a return call

## 2013-06-05 NOTE — Progress Notes (Signed)
Follow up SBRT right lung, 01/25/14; 01/30/13; 02/02/13. atient 94% room amir, loose cogestive cough, +yellowish phelgm, her humidifier oxygen water went into her lungs, was placed on kezaquin this past Friday for 10 days, rib cage soreness from coughing so much states patient, feels a lot better than Friday, eating good, fatigued,  In w/c, very sob with mild exertion 2:32 PM

## 2013-08-21 ENCOUNTER — Telehealth: Payer: Self-pay | Admitting: *Deleted

## 2013-08-21 NOTE — Telephone Encounter (Signed)
On 08-21-13 fax medical records to [facility], it was consult note, end of tx note, follow up note.

## 2013-12-04 ENCOUNTER — Ambulatory Visit: Payer: Medicare Other | Admitting: Radiation Oncology

## 2013-12-21 ENCOUNTER — Encounter: Payer: Self-pay | Admitting: Radiation Oncology

## 2013-12-21 ENCOUNTER — Ambulatory Visit (HOSPITAL_COMMUNITY)
Admission: RE | Admit: 2013-12-21 | Discharge: 2013-12-21 | Disposition: A | Discharge status: Home / Self Care | Payer: Medicare Other | Source: Ambulatory Visit | Attending: Radiation Oncology | Admitting: Radiation Oncology

## 2013-12-21 ENCOUNTER — Ambulatory Visit
Admission: RE | Admit: 2013-12-21 | Discharge: 2013-12-21 | Disposition: A | Discharge status: Home / Self Care | Payer: Medicare Other | Source: Ambulatory Visit | Attending: Radiation Oncology | Admitting: Radiation Oncology

## 2013-12-21 VITALS — BP 116/64 | HR 91 | Temp 98.2°F | Ht 63.0 in | Wt 130.8 lb

## 2013-12-21 DIAGNOSIS — I700 Atherosclerosis of aorta: Secondary | ICD-10-CM | POA: Diagnosis not present

## 2013-12-21 DIAGNOSIS — IMO0002 Reserved for concepts with insufficient information to code with codable children: Secondary | ICD-10-CM | POA: Diagnosis not present

## 2013-12-21 DIAGNOSIS — R0602 Shortness of breath: Secondary | ICD-10-CM | POA: Insufficient documentation

## 2013-12-21 DIAGNOSIS — J984 Other disorders of lung: Secondary | ICD-10-CM | POA: Diagnosis not present

## 2013-12-21 DIAGNOSIS — C3490 Malignant neoplasm of unspecified part of unspecified bronchus or lung: Secondary | ICD-10-CM | POA: Diagnosis not present

## 2013-12-21 DIAGNOSIS — C3411 Malignant neoplasm of upper lobe, right bronchus or lung: Secondary | ICD-10-CM

## 2013-12-21 DIAGNOSIS — I2510 Atherosclerotic heart disease of native coronary artery without angina pectoris: Secondary | ICD-10-CM | POA: Insufficient documentation

## 2013-12-21 DIAGNOSIS — M959 Acquired deformity of musculoskeletal system, unspecified: Secondary | ICD-10-CM | POA: Diagnosis not present

## 2013-12-21 NOTE — Progress Notes (Signed)
Radiation Oncology         [phone]) [phone] ________________________________  Name: Jenny Howe MRN: [id number]  Date: 12/21/2013  DOB: 1946-04-26  Follow-Up Visit Note  CC: Helen Hashimoto., MD  Helen Hashimoto., MD  Diagnosis:  Clinical stage I non-small cell lung cancer presenting in the right upper lobe    Interval Since Last Radiation:  10  months  Narrative:  The patient returns today for routine follow-up.  Since her last visit the patient has moved to the [facility] area. She was brought up today several family members for appointment. Patient did get lost and is approximately 2 hours late for her appointment. Fortunately our clinic with still open. She continues on supplemental oxygen primarily at night and with exertion. She denies any pain in the chest area, cough or hemoptysis. Patient has run out of her prednisone and Proventil for which she was given a limited prescription. She has established followup care in the [place] area. The patient's primary care physician is Elnoria Howard  and her pulmonologist is Dr. Darral Dash. She also sees  Dr. Pilar Plate at the pain clinic. Her pharmacy in the [place] area is [facility] area code [phone]                            ALLERGIES:  is allergic to penicillins; procaine; codeine; ivp dye; and povidone-iodine.  Meds: Current Outpatient Prescriptions  Medication Sig Dispense Refill  . albuterol (PROVENTIL HFA;VENTOLIN HFA) 108 (90 BASE) MCG/ACT inhaler Inhale 2 puffs into the lungs daily as needed for wheezing.      Marland Kitchen albuterol (PROVENTIL) (2.5 MG/3ML) 0.083% nebulizer solution Take 2.5 mg by nebulization every 6 (six) hours as needed for wheezing.      Marland Kitchen atorvastatin (LIPITOR) 40 MG tablet Take 40 mg by mouth daily.      Marland Kitchen azelastine (ASTELIN) 137 MCG/SPRAY nasal spray Place 2 sprays into the nose daily. Use in each nostril as directed      . cetirizine (ZYRTEC) 10 MG tablet Take 10 mg by mouth daily.        . Cholecalciferol (VITAMIN D) 2000 UNITS CAPS Take by mouth daily.      Marland Kitchen gabapentin (NEURONTIN) 600 MG tablet Take 600 mg by mouth 2 (two) times daily.      Javier Docker Oil 500 MG CAPS Take by mouth daily.      Marland Kitchen levothyroxine (SYNTHROID, LEVOTHROID) 112 MCG tablet Take 112 mcg by mouth daily before breakfast.      . loperamide (IMODIUM) 2 MG capsule Take by mouth as needed for diarrhea or loose stools.      . meclizine (ANTIVERT) 25 MG tablet Take 25 mg by mouth 3 (three) times daily.      Marland Kitchen morphine (MSIR) 15 MG tablet Take 15 mg by mouth 2 (two) times daily.      Marland Kitchen oxyCODONE-acetaminophen (PERCOCET) 10-325 MG per tablet Take 1 tablet by mouth every 4 (four) hours as needed for pain.      . Probiotic Product (SOLUBLE FIBER/PROBIOTICS PO) Take 1 tablet by mouth daily.      . vitamin C (ASCORBIC ACID) 500 MG tablet Take 500 mg by mouth daily.      . vitamin E 1000 UNIT capsule Take 1,000 Units by mouth daily.      . diclofenac (FLECTOR) 1.3 % PTCH Place 1 patch onto the skin 2 (two) times daily.      Marland Kitchen  levofloxacin (LEVAQUIN) 750 MG tablet Take 750 mg by mouth daily. For 10 days       No current facility-administered medications for this encounter.    Physical Findings: The patient is in no acute distress. Patient is alert and oriented.  height is 5\' 3"  (1.6 m) and weight is 130 lb 12.8 oz (59.33 kg). Her oral temperature is 98.2 F (36.8 C). Her blood pressure is 116/64 and her pulse is 91. Her oxygen saturation is 94%. .  No palpable supraclavicular or axillary adenopathy. The lungs are clear to auscultation. The heart has a regular rhythm and rate. The oral cavity is free of secondary infection.  Lab Findings: No results found for this basename: WBC, HGB, HCT, MCV, PLT      Radiographic Findings: No results found.  Impression:  Clinical stage I non-small cell lung cancer presenting in the right upper lobe. Radiology has graciously agreed to proceed with a chest CT scan this evening  for followup. Patient will return to the [facility] area  this evening and continue her lung cancer followup with her pulmonologist or primary care physician.  Plan:  When necessary followup in radiation oncology.  ____________________________________ Blair Promise, MD

## 2013-12-21 NOTE — Progress Notes (Signed)
Jenny Howe here for follow up after treatment to her right upper lobe.  She reports chronic pain in her bones at an 8/10.  She is currently taking morphine 15 mg bid and also 3-4 percocet per day.  She is seeing Dr. Pilar Plate at the pain clinic.  She reports a productive cough with clear sputum.  She denies hemoptysis.  She reports shortness of breath with activity.  She is using 2.5 l of oxygen as needed.  She is seeing Dr. Darral Dash for pulmonary in [place].  She has been using a proair inhaler which is not helping her.  She reports fatigue.

## 2013-12-26 ENCOUNTER — Telehealth: Payer: Self-pay | Admitting: Oncology

## 2013-12-26 ENCOUNTER — Other Ambulatory Visit: Payer: Self-pay | Admitting: Oncology

## 2013-12-26 NOTE — Telephone Encounter (Signed)
Called in refills per Dr. Sondra Come to the [facility], [place], [place] for albuterol (PROVENTIL HFA;VENTOLIN [facility]) 108 (90 BASE) MCG/ACT inhaler 12/26/2013 Sig - Route: Inhale 2 puffs into the lungs daily as needed for wheezing and PREDNISONE 10 mg tablets, Take 10 mg by mouth daily. Dispense 30 tablets. 0 refills.

## 2013-12-27 ENCOUNTER — Telehealth: Payer: Self-pay | Admitting: Oncology

## 2013-12-27 NOTE — Telephone Encounter (Signed)
Called and left Shaira a message that her refills had been called in to [facility] yesterday.

## 2014-03-28 ENCOUNTER — Other Ambulatory Visit (HOSPITAL_COMMUNITY): Payer: Self-pay | Admitting: Pulmonary Disease

## 2014-03-28 DIAGNOSIS — C3490 Malignant neoplasm of unspecified part of unspecified bronchus or lung: Secondary | ICD-10-CM

## 2014-03-30 ENCOUNTER — Ambulatory Visit (HOSPITAL_COMMUNITY)
Admission: RE | Admit: 2014-03-30 | Discharge: 2014-03-30 | Disposition: A | Discharge status: Home / Self Care | Payer: Medicare Other | Source: Ambulatory Visit | Attending: Pulmonary Disease | Admitting: Pulmonary Disease

## 2014-03-30 DIAGNOSIS — J449 Chronic obstructive pulmonary disease, unspecified: Secondary | ICD-10-CM | POA: Insufficient documentation

## 2014-03-30 DIAGNOSIS — Z85118 Personal history of other malignant neoplasm of bronchus and lung: Secondary | ICD-10-CM | POA: Diagnosis not present

## 2014-03-30 DIAGNOSIS — R918 Other nonspecific abnormal finding of lung field: Secondary | ICD-10-CM | POA: Diagnosis not present

## 2014-03-30 DIAGNOSIS — C3490 Malignant neoplasm of unspecified part of unspecified bronchus or lung: Secondary | ICD-10-CM

## 2014-03-30 DIAGNOSIS — Z923 Personal history of irradiation: Secondary | ICD-10-CM | POA: Diagnosis not present

## 2014-04-04 ENCOUNTER — Telehealth: Payer: Self-pay | Admitting: Oncology

## 2014-04-04 NOTE — Telephone Encounter (Signed)
Jenny Howe called requesting a call back from Dr. Sondra Come regarding her CT results from 11/6.

## 2014-04-05 ENCOUNTER — Telehealth: Payer: Self-pay | Admitting: Oncology

## 2014-04-05 NOTE — Telephone Encounter (Signed)
Rennie left a message and said the fax number for Dr. Darral Dash is [phone].

## 2014-04-09 NOTE — Progress Notes (Signed)
Thoracic Location of Tumor / Histology: right upper lung - CT of chest from 03/30/14 reveals "there is increasing overlying pleural thickening in this area of right upper lobe scar. In addition, the adjacent anterior right fourth rib demonstrates a nondisplaced fracture and there is mild chest wall soft tissue thickening anterior to this rib fracture."  Patient presented with symptoms of: abnormal chest imaging and pink tinged sputum.  Biopsies of  (if applicable) revealed:   Tobacco/Marijuana/Snuff/ETOH use: smoked 1 ppd for 45 years.  No etoh/drug use.  Past/Anticipated interventions by cardiothoracic surgery, if any:   Past/Anticipated interventions by medical oncology, if any:   Signs/Symptoms  Weight changes, if any:   Respiratory complaints, if any:   Hemoptysis, if any:   Pain issues, if any:    SAFETY ISSUES:  Prior radiation? 01/25/2013, 01/30/2013, 02/02/2013 - 54 gray to right upper lobe  Pacemaker/ICD?    Possible current pregnancy?no  Is the patient on methotrexate? no  Current Complaints / other details:

## 2014-04-11 ENCOUNTER — Ambulatory Visit
Admission: RE | Admit: 2014-04-11 | Discharge: 2014-04-11 | Disposition: A | Discharge status: Home / Self Care | Payer: Medicare Other | Source: Ambulatory Visit | Attending: Radiation Oncology | Admitting: Radiation Oncology

## 2014-04-11 ENCOUNTER — Ambulatory Visit: Payer: Medicare Other

## 2014-04-11 DIAGNOSIS — C3411 Malignant neoplasm of upper lobe, right bronchus or lung: Secondary | ICD-10-CM | POA: Insufficient documentation

## 2014-04-11 DIAGNOSIS — Z510 Encounter for antineoplastic radiation therapy: Secondary | ICD-10-CM | POA: Insufficient documentation

## 2014-04-18 NOTE — Progress Notes (Addendum)
Thoracic Location of Tumor / Histology: right upper lung - CT of chest from 03/30/14 reveals "there is increasing overlying pleural thickening in this area of right upper lobe scar. In addition, the adjacent anterior right fourth rib demonstrates a nondisplaced fracture and there is mild chest wall soft tissue thickening anterior to this rib fracture."  Patient presented with symptoms of: abnormal chest imaging and pink tinged sputum.  Tobacco/Marijuana/Snuff/ETOH use: smoked 1 ppd for 45 years. No etoh/drug use.  Past/Anticipated interventions by cardiothoracic surgery, if any: no  Past/Anticipated interventions by medical oncology, if any: no  Signs/Symptoms  Weight changes, if any: no  Respiratory complaints, if any: yes - has a frequent cough with clear/greenish sputum - currently taking Levaquin.  She is not taking prednisone at this time.  Hemoptysis, if any: no  Pain issues, if any: yes - has chronic back pain rating at an 8/10.  Takes morphine 30 mg twice a day and percocet 10/325 4 times a day.  SAFETY ISSUES:  Prior radiation? 01/25/2013, 01/30/2013, 02/02/2013 - 54 gray to right upper lobe  Pacemaker/ICD? no  Possible current pregnancy?no  Is the patient on methotrexate? no  Current Complaints / other details: Patient is here with her husband and daughter. Patient uses oxygen, 2.5 liters as needed.

## 2014-04-25 ENCOUNTER — Telehealth: Payer: Self-pay | Admitting: Oncology

## 2014-04-25 ENCOUNTER — Ambulatory Visit
Admission: RE | Admit: 2014-04-25 | Discharge: 2014-04-25 | Disposition: A | Discharge status: Home / Self Care | Payer: Medicare Other | Source: Ambulatory Visit | Attending: Radiation Oncology | Admitting: Radiation Oncology

## 2014-04-25 ENCOUNTER — Encounter: Payer: Self-pay | Admitting: Radiation Oncology

## 2014-04-25 VITALS — BP 108/57 | HR 92 | Temp 99.1°F | Resp 20 | Ht 63.0 in | Wt 125.8 lb

## 2014-04-25 DIAGNOSIS — Z510 Encounter for antineoplastic radiation therapy: Secondary | ICD-10-CM | POA: Diagnosis present

## 2014-04-25 DIAGNOSIS — C3411 Malignant neoplasm of upper lobe, right bronchus or lung: Secondary | ICD-10-CM

## 2014-04-25 MED ORDER — ALBUTEROL SULFATE HFA 108 (90 BASE) MCG/ACT IN AERS: 2.0000 | INHALATION_SPRAY | Freq: Every day | RESPIRATORY_TRACT | Status: DC | PRN

## 2014-04-25 NOTE — Telephone Encounter (Signed)
[facility] in [place], [place] to call in refill for albuterol - Proventil inhaler.  Per the pharmacist, the Proventil inhaler has already been refilled by her local doctor with 5 additional refills.  Called [name]'s daughter, [name], to let her know that refills are already available.  [name] said she would let [name] know.

## 2014-04-25 NOTE — Progress Notes (Signed)
Please see the Nurse Progress Note in the MD Initial Consult Encounter for this patient. 

## 2014-04-25 NOTE — Progress Notes (Signed)
Radiation Oncology         [phone]) [phone] ________________________________  Name: Jenny Howe MRN: [id number]  Date: 04/25/2014  DOB: 09-25-1945  Follow-Up Visit Note  CC: Helen Hashimoto., MD  Etheleen Sia, MD    ICD-9-CM ICD-10-CM   1. Malignant neoplasm of upper lobe of right lung 162.3 C34.11     Diagnosis: Clinical stage I non-small cell lung cancer presenting in the right upper lobe     Interval Since Last Radiation:  One year and three-months   Narrative:  The patient returns today for further evaluation. Patient was moved to the [place] area continues to receive her chest CT scans here in [place] for followup. Earlier this fall the patient was seen by her pulmonologist Dr. Christen Butter and a chest x-ray was performed which is concerning for possible recurrence. She was also felt to have bronchitis and placed on Levaquin. The patient's breathing is stable. She uses oxygen when active and at night. She does not require oxygen when sitting. She did have some mild pain in the right upper chest where she was noted to have a rib fracture. The patient has had at least 3 other rib fractures related to excessive coughing. She denies any hemoptysis headaches dizziness or blurred vision.                             ALLERGIES:  is allergic to penicillins; procaine; codeine; ivp dye; and povidone-iodine.  Meds: Current Outpatient Prescriptions  Medication Sig Dispense Refill  . albuterol (PROVENTIL HFA;VENTOLIN HFA) 108 (90 BASE) MCG/ACT inhaler Inhale 2 puffs into the lungs daily as needed for wheezing. Refill called in to [place], [place] per Dr. Sondra Come. 6.7 g 3  . albuterol (PROVENTIL) (2.5 MG/3ML) 0.083% nebulizer solution Take 2.5 mg by nebulization every 6 (six) hours as needed for wheezing.    Marland Kitchen atorvastatin (LIPITOR) 40 MG tablet Take 40 mg by mouth daily.    . celecoxib (CELEBREX) 200 MG capsule Take 200 mg by mouth.    . cetirizine (ZYRTEC) 10 MG tablet Take 10  mg by mouth daily.    . Cholecalciferol (VITAMIN D) 2000 UNITS CAPS Take by mouth daily.    . diclofenac sodium (VOLTAREN) 1 % GEL Apply topically to the affected joint 4 times a day    . gabapentin (NEURONTIN) 600 MG tablet Take 600 mg by mouth 2 (two) times daily.    Javier Docker Oil 500 MG CAPS Take by mouth daily.    Marland Kitchen levofloxacin (LEVAQUIN) 750 MG tablet Take 750 mg by mouth daily. For 10 days    . levothyroxine (SYNTHROID, LEVOTHROID) 112 MCG tablet Take 112 mcg by mouth daily before breakfast.    . meclizine (ANTIVERT) 25 MG tablet Take 25 mg by mouth 3 (three) times daily.    Marland Kitchen morphine (MSIR) 15 MG tablet Take 30 mg by mouth 2 (two) times daily.     . Multiple Vitamin (MULTIVITAMIN) tablet Take 1 tablet by mouth daily.    Marland Kitchen oxyCODONE-acetaminophen (PERCOCET) 10-325 MG per tablet Take 1 tablet by mouth every 4 (four) hours as needed for pain.    . Probiotic Product (SOLUBLE FIBER/PROBIOTICS PO) Take 1 tablet by mouth daily.    . vitamin C (ASCORBIC ACID) 500 MG tablet Take 500 mg by mouth daily.    . vitamin E 1000 UNIT capsule Take 1,000 Units by mouth daily.    Marland Kitchen azelastine (ASTELIN) 137 MCG/SPRAY  nasal spray Place 2 sprays into the nose daily. Use in each nostril as directed    . diclofenac (FLECTOR) 1.3 % PTCH Place 1 patch onto the skin 2 (two) times daily.    Marland Kitchen PREDNISONE PO Take 10 mg by mouth daily. Take 1 tablet (10 mg) dialy.  Dispense 30 tablets. 0 refills.  Called in to [place], [place] per Dr. Sondra Come.     No current facility-administered medications for this encounter.    Physical Findings: The patient is in no acute distress. Patient is alert and oriented.  height is 5\' 3"  (1.6 m) and weight is 125 lb 12.8 oz (57.063 kg). Her oral temperature is 99.1 F (37.3 C). Her blood pressure is 108/57 and her pulse is 92. Her respiration is 20 and oxygen saturation is 92%. .  No palpable supraclavicular or axillary adenopathy. The lungs are clear to auscultation. The heart  has regular rhythm and rate.  Lab Findings: No results found for: WBC, HGB, HCT, MCV, PLT  Radiographic Findings: Ct Chest Wo Contrast  03/30/2014   CLINICAL DATA:  Left anterior rib pain. Abnormal chest x-ray. Worrisome for lung cancer recurrence. History of lung cancer with radiation and COPD.  EXAM: CT CHEST WITHOUT CONTRAST  TECHNIQUE: Multidetector CT imaging of the chest was performed following the standard protocol without IV contrast.  COMPARISON:  06/09/2013, 12/21/2013  FINDINGS: Areas of scarring in the right upper lobe anteriorly. Nodular area within the scarring measures 2.1 x 0.9 cm compared with 2.0 x 0.8 cm previously, not significantly changed. However, there is increasing pleural thickening adjacent to this area of right upper lobe scarring and nodularity. Areas of scarring in the left upper lobe and both lower lobes are stable. Peripheral/interstitial reticulation again noted, stable. No pleural effusions.  There is mild soft tissue thickening in the right anterior chest wall anterior to the area of right upper lobe scarring. This may be related to radiation changes. This is stable. There is a nondisplaced anterior rib fracture on the right in the area of soft tissue thickening. This involves the anterior right fourth rib. No visible rib lesion but cannot completely exclude pathologic fracture.  No new pulmonary nodules.  Prior vertebroplasty in a mid thoracic vertebral body. Mild compression fracture at this level, stable.  Imaging into the upper abdomen shows no acute findings.  IMPRESSION: Areas of scarring in the lungs bilaterally. Focal areas scarring in the right upper lobe adjacent to the nodular area is stable. The nodule itself is also stable.  There is increasing overlying pleural thickening in this area of right upper lobe scar. In addition, the adjacent anterior right fourth rib demonstrates a nondisplaced fracture and there is mild chest wall soft tissue thickening anterior to  this rib fracture. The soft tissue thickening is stable since prior study. Cannot completely exclude pathologic fracture. The pleural thickening could be related to postradiation changes, but recommend attention this area on followup imaging to exclude further progression.   Electronically Signed   By: Rolm Baptise M.D.   On: 03/30/2014 13:19    Impression:  Clinically stable status post SBRT. Patient's most recent chest CT scan from last month shows increase scarring around the prior high dose radiation therapy, not unexpected with her SBRT. The pulmonary nodule is stable without any progression. The patient's rib fracture was out of the high dose radiation field so this is likely a benign fracture from coughing and osteoporosis.  Plan:  Routine followup in May of along  with a repeat chest CT scan  ____________________________________ Blair Promise, MD

## 2014-04-26 ENCOUNTER — Telehealth: Payer: Self-pay | Admitting: Oncology

## 2014-04-26 ENCOUNTER — Telehealth: Payer: Self-pay | Admitting: *Deleted

## 2014-04-26 NOTE — Telephone Encounter (Signed)
Nicolle called and said she needs the "yellow Proventil inhaler" called in, not the red one.  Talked to the pharmacist who said that proair and Proventil are the same but just have different packaging.  Gave him the prescription for albuterol (PROVENTIL HFA;VENTOLIN HFA) 108 (90 BASE) MCG/ACT inhaler Sig - Route: Inhale 2 puffs into the lungs daily as needed for wheezing.  6.7 g with 3 refills.    Sumaiya called again and said the ventalin is in a Sports [employer] and does not work.  She said she needs the combivent inhaler.  Advised her that Dr. Sondra Come would be notified and we will call her back.

## 2014-04-26 NOTE — Telephone Encounter (Signed)
CALLED PATIENT TO INFORM OF TEST AND FU, SPOKE WITH PATIENT AND SHE IS AWARE OF THESE APPTS.

## 2014-04-26 NOTE — Telephone Encounter (Signed)
Called in per Dr. Sondra Come to [facility] - Ipratropium-Albuterol (COMBIVENT) 20-100 MCG/ACT AERS respimat - inhale 1 puff into the lungs every 6 (six) hours.  3 refills.  Called Tonilynn back to let her know it had been called in.

## 2014-04-27 ENCOUNTER — Encounter: Payer: Self-pay | Admitting: *Deleted

## 2014-04-27 NOTE — Progress Notes (Signed)
[facility] Psychosocial Distress Screening Clinical Social Work  Clinical Social Work was referred by distress screening protocol.  The patient scored a 10 on the Psychosocial Distress Thermometer which indicates severe distress. Clinical Social Worker contacted by phone to assess for distress and other psychosocial needs. Ms. Alameda reports her main cause of distress is housing.  She currently lives in [place], [place] (near [place]) and is trying to move into another home in that area.  CSW is unaware of housing resources in that area.  CSW provided brief emotional support and encouraged patient to call CSW for additional support by phone.  ONCBCN DISTRESS SCREENING 04/25/2014  Screening Type Initial Screening  Distress experienced in past week (1-10) 10  Practical problem type Housing  Family Problem type Partner  Emotional problem type Depression;Isolation/feeling alone;Adjusting to appearance changes  Spiritual/Religous concerns type Facing my mortality;Loss of sense of purpose  Physical Problem type Pain;Sleep/insomnia;Getting around;Bathing/dressing;Breathing;Changes in urination;Tingling hands/feet;Skin dry/itchy;Swollen arms/legs  Physician notified of physical symptoms Yes    Clinical Social Worker follow up needed: No.  If yes, follow up plan:  Polo Riley, MSW, LCSW, OSW-C Clinical Social Worker [facility] [phone]

## 2014-06-19 ENCOUNTER — Ambulatory Visit: Payer: Medicare Other | Admitting: Radiation Oncology

## 2014-09-24 ENCOUNTER — Telehealth: Payer: Self-pay | Admitting: *Deleted

## 2014-09-24 NOTE — Telephone Encounter (Signed)
CALLED PATIENT TO ASK HER TO CALL ME TO INFORM HER THAT HER FU VISIT HAS BEEN MOVED TO 10-15-14 @ 4:20 PM DUE TO THE FACT THAT SHE ALTERED HER CT TO 10-04-14, LVM FOR A RETURN CALL

## 2014-09-25 ENCOUNTER — Ambulatory Visit (HOSPITAL_COMMUNITY): Payer: Medicare Other

## 2014-09-27 ENCOUNTER — Ambulatory Visit: Payer: Medicare Other | Admitting: Radiation Oncology

## 2014-10-04 ENCOUNTER — Ambulatory Visit: Payer: Self-pay | Admitting: Radiation Oncology

## 2014-10-04 ENCOUNTER — Ambulatory Visit (HOSPITAL_COMMUNITY)
Admission: RE | Admit: 2014-10-04 | Discharge: 2014-10-04 | Disposition: A | Discharge status: Home / Self Care | Payer: Medicare Other | Source: Ambulatory Visit | Attending: Radiation Oncology | Admitting: Radiation Oncology

## 2014-10-04 ENCOUNTER — Encounter (HOSPITAL_COMMUNITY): Payer: Self-pay

## 2014-10-04 DIAGNOSIS — X58XXXA Exposure to other specified factors, initial encounter: Secondary | ICD-10-CM | POA: Diagnosis not present

## 2014-10-04 DIAGNOSIS — S2231XA Fracture of one rib, right side, initial encounter for closed fracture: Secondary | ICD-10-CM | POA: Insufficient documentation

## 2014-10-04 DIAGNOSIS — N200 Calculus of kidney: Secondary | ICD-10-CM | POA: Diagnosis not present

## 2014-10-04 DIAGNOSIS — I2510 Atherosclerotic heart disease of native coronary artery without angina pectoris: Secondary | ICD-10-CM | POA: Diagnosis not present

## 2014-10-04 DIAGNOSIS — C3411 Malignant neoplasm of upper lobe, right bronchus or lung: Secondary | ICD-10-CM | POA: Diagnosis not present

## 2014-10-15 ENCOUNTER — Telehealth: Payer: Self-pay | Admitting: Oncology

## 2014-10-15 ENCOUNTER — Ambulatory Visit: Admission: RE | Admit: 2014-10-15 | Payer: Medicare Other | Source: Ambulatory Visit | Admitting: Radiation Oncology

## 2014-10-15 NOTE — Telephone Encounter (Signed)
Jenny Howe regarding her follow up appointment today with Dr. Sondra Come.  Jenny Howe said she thought the appointment was for 5/26 at 4 pm.  Advised her that Jenny Howe, Jenny Howe therapist will be calling her to reschedule the appointment.  [name] verbalized agreement.

## 2014-10-18 ENCOUNTER — Encounter: Payer: Self-pay | Admitting: Radiation Oncology

## 2014-10-18 ENCOUNTER — Ambulatory Visit
Admission: RE | Admit: 2014-10-18 | Discharge: 2014-10-18 | Disposition: A | Discharge status: Home / Self Care | Payer: Medicare Other | Source: Ambulatory Visit | Attending: Radiation Oncology | Admitting: Radiation Oncology

## 2014-10-18 VITALS — BP 153/66 | HR 92 | Temp 99.0°F | Resp 16 | Ht 63.0 in | Wt 129.5 lb

## 2014-10-18 DIAGNOSIS — C3411 Malignant neoplasm of upper lobe, right bronchus or lung: Secondary | ICD-10-CM

## 2014-10-18 NOTE — Progress Notes (Signed)
Jenny Howe here for follow up.  She reports having chronic pain in her back.  She is currently talking morphine three times a day and percocet in between.  She denies having a cough and hemoptysis.  She reports her shortness of breath is the same.  Her oxgyen saturation is 93% on 2.5 Liters of oxgyen.  She reports having a good appetite.  She reports feeling fatigue.  She is in a wheelchair today.  BP 153/66 mmHg  Pulse 92  Temp(Src) 99 F (37.2 C) (Oral)  Resp 16  Ht '5\' 3"'$  (1.6 m)  Wt 129 lb 8 oz (58.741 kg)  BMI 22.95 kg/m2  SpO2 93%   Wt Readings from Last 3 Encounters:  12/21/12 128 lb (58.06 kg)

## 2014-10-18 NOTE — Progress Notes (Signed)
Radiation Oncology         [phone]) [phone] ________________________________  Name: Jenny Howe MRN: [id number]  Date: 10/18/2014  DOB: May 06, 1946  Follow-Up Visit Note  CC: CAMPBELL, Estill Bamberg., MD  Jenny Potter, MD  Diagnosis:  Clinical stage I non-small cell lung cancer presenting in the right upper lobe   Interval Since Last Radiation:  1 year 8 months  Narrative:  The patient returns today for routine follow-up.  Jamey Reas here for follow up.She continues to live in the [place] area. She wishes to continue follow-up of her early stage lung cancer here in [place]. She reports having chronic pain in her back. She is currently talking morphine three times a day and percocet in between. She denies having a cough and hemoptysis. She reports her shortness of breath is the same. Her oxgyen saturation is 93% on 2.5 Liters of oxgyen. She reports having a good appetite. She reports feeling fatigue. She is in a wheelchair today. 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 Uses 2.5 L of O2 during the day sometimes at night uses 5 L.   ALLERGIES:  is allergic to penicillins; procaine; codeine; ivp dye; and povidone-iodine.  Meds: Current Outpatient Prescriptions  Medication Sig Dispense Refill  . albuterol (PROVENTIL HFA;VENTOLIN HFA) 108 (90 BASE) MCG/ACT inhaler Inhale 2 puffs into the lungs daily as needed for wheezing. Refill called in to [place], [place] per Dr. Sondra Come. 6.7 g 3  . albuterol (PROVENTIL) (2.5 MG/3ML) 0.083% nebulizer solution Take 2.5 mg by nebulization every 6 (six) hours as needed for wheezing.    Marland Kitchen atorvastatin (LIPITOR) 40 MG tablet Take 40 mg by mouth daily.    . cetirizine (ZYRTEC) 10 MG tablet Take 10 mg by mouth daily.    . Cholecalciferol (VITAMIN D) 2000 UNITS CAPS Take by mouth daily.    Marland Kitchen gabapentin (NEURONTIN) 600 MG tablet Take 600 mg by mouth 2 (two) times daily.    Javier Docker Oil 500 MG CAPS Take by mouth daily.     Marland Kitchen levothyroxine (SYNTHROID, LEVOTHROID) 112 MCG tablet Take 112 mcg by mouth daily before breakfast.    . meclizine (ANTIVERT) 25 MG tablet Take 25 mg by mouth 3 (three) times daily.    Marland Kitchen morphine (MSIR) 15 MG tablet Take 30 mg by mouth 2 (two) times daily.     . Multiple Vitamin (MULTIVITAMIN) tablet Take 1 tablet by mouth daily.    Marland Kitchen oxyCODONE-acetaminophen (PERCOCET) 10-325 MG per tablet Take 1 tablet by mouth every 4 (four) hours as needed for pain.    . Probiotic Product (SOLUBLE FIBER/PROBIOTICS PO) Take 1 tablet by mouth daily.    . vitamin C (ASCORBIC ACID) 500 MG tablet Take 500 mg by mouth daily.    . vitamin E 1000 UNIT capsule Take 1,000 Units by mouth daily.    Marland Kitchen azelastine (ASTELIN) 137 MCG/SPRAY nasal spray Place 2 sprays into the nose daily. Use in each nostril as directed    . celecoxib (CELEBREX) 200 MG capsule Take 200 mg by mouth.    . diclofenac (FLECTOR) 1.3 % PTCH Place 1 patch onto the skin 2 (two) times daily.    . diclofenac sodium (VOLTAREN) 1 % GEL Apply topically to the affected joint 4 times a day    . Ipratropium-Albuterol (COMBIVENT) 20-100 MCG/ACT AERS respimat Inhale 1 puff into the lungs every 6 (six) hours. Called in to [facility] in [place], [place] per Dr. Sondra Come with 3 refills.    Marland Kitchen  levofloxacin (LEVAQUIN) 750 MG tablet Take 750 mg by mouth daily. For 10 days    . PREDNISONE PO Take 10 mg by mouth daily. Take 1 tablet (10 mg) dialy.  Dispense 30 tablets. 0 refills.  Called in to [place], [place] per Dr. Sondra Come.     No current facility-administered medications for this encounter.    Physical Findings: The patient is in no acute distress. Patient is alert and orientedBP 153/66 mmHg  Pulse 92  Temp(Src) 99 F (37.2 C) (Oral)  Resp 16  Ht '5\' 3"'$  (1.6 m)  Wt 129 lb 8 oz (58.741 kg)  BMI 22.95 kg/m2  SpO2 93% .  No palpable supraclavicular or axillary adenopathy. The lungs are clear to auscultation. The heart has a regular rhythm and rate.    Wt. Readings from Last 3 Encounters: 12/21/12         128 lb (58.06 kg)  Lab Findings: No results found for: WBC   Radiographic Findings:  IMPRESSION: 1. Improving right upper lobe radiation changes, without residual well-defined pulmonary nodule. 2. Anterior right fourth rib fracture again identified with decrease in adjacent pleural thickening and decrease to resolution in adjacent chest wall soft tissue fullness. Favored to be related to improving radiation change. 3. No evidence of metastatic disease. 4. Pulmonary artery enlargement suggests pulmonary arterial hypertension. 5. Osteopenia with a new moderate T9 compression deformity. Favored to be due to osteopenia rather than an underlying metastatic lesion. 6. Atherosclerosis, including within the coronary arteries. 7. Right nephrolithiasis.   Electronically Signed  By: Abigail Miyamoto M.D.  On: 10/04/2014 10:02  Impression:  Patient is clinically doing well. No reoccurrence of disease on CT scan noted.   Plan:  Follow up in 6 months with CT scan.  This document serves as a record of services personally performed by Gery Pray, MD. It was created on his behalf by Jeralene Peters, a trained medical scribe. The creation of this record is based on the scribe's personal observations and the provider's statements to them. This document has been checked and approved by the attending provider.     ____________________________________ Blair Promise, PhD., MD

## 2015-01-04 ENCOUNTER — Telehealth: Payer: Self-pay | Admitting: *Deleted

## 2015-01-04 NOTE — Telephone Encounter (Signed)
On 01-04-15 fax medical records to [facility] health pulmonary it was consult note, end of tx note, follow up notes, ct chest done 10-04-14, 12-21-13.

## 2015-05-09 ENCOUNTER — Ambulatory Visit: Payer: Medicare Other | Admitting: Radiation Oncology

## 2015-06-20 ENCOUNTER — Ambulatory Visit: Payer: Medicare Other | Admitting: Radiation Oncology

## 2015-07-25 ENCOUNTER — Ambulatory Visit: Payer: Medicare Other | Admitting: Radiation Oncology

## 2015-08-22 ENCOUNTER — Ambulatory Visit (HOSPITAL_COMMUNITY): Payer: Medicare Other

## 2015-08-22 ENCOUNTER — Ambulatory Visit (HOSPITAL_COMMUNITY)
Admission: RE | Admit: 2015-08-22 | Discharge: 2015-08-22 | Disposition: A | Discharge status: Home / Self Care | Payer: Medicare Other | Source: Ambulatory Visit | Attending: Radiation Oncology | Admitting: Radiation Oncology

## 2015-08-22 ENCOUNTER — Encounter: Payer: Self-pay | Admitting: Radiation Oncology

## 2015-08-22 VITALS — BP 169/60 | HR 85 | Temp 98.5°F | Ht 63.0 in | Wt 109.5 lb

## 2015-08-22 DIAGNOSIS — C3411 Malignant neoplasm of upper lobe, right bronchus or lung: Secondary | ICD-10-CM | POA: Diagnosis not present

## 2015-08-22 DIAGNOSIS — I700 Atherosclerosis of aorta: Secondary | ICD-10-CM | POA: Diagnosis not present

## 2015-08-22 DIAGNOSIS — R599 Enlarged lymph nodes, unspecified: Secondary | ICD-10-CM | POA: Diagnosis not present

## 2015-08-22 DIAGNOSIS — J479 Bronchiectasis, uncomplicated: Secondary | ICD-10-CM | POA: Insufficient documentation

## 2015-08-22 DIAGNOSIS — R918 Other nonspecific abnormal finding of lung field: Secondary | ICD-10-CM | POA: Diagnosis not present

## 2015-08-22 DIAGNOSIS — I2510 Atherosclerotic heart disease of native coronary artery without angina pectoris: Secondary | ICD-10-CM | POA: Diagnosis not present

## 2015-08-22 NOTE — Progress Notes (Signed)
u   Radiation Oncology         [phone]) [phone] ________________________________  Name: Jenny Howe MRN: [id number]  Date: 08/22/2015  DOB: November 01, 1945  Follow-Up Visit Note  CC: Helen Hashimoto., MD  Helen Hashimoto., MD    ICD-9-CM ICD-10-CM   1. Malignant neoplasm of upper lobe of right lung ([place]) 162.3 C34.11 CT Chest Wo Contrast    Diagnosis: Clinical stage I s/p SBRT non-small cell lung cancer presenting in the right upper lobe   Radiation treatment dates:   September 3, September 8, February 02, 2013 Site/dose:   Right upper lobe nodule, 54 gray in 3 fractions Interval since radiation therapy: 2 years and 6 months  Narrative:  The patient returns today for routine follow-up.   She continues to report having pain in her back especially when coughing.She is rating it at a 8/10 today. She is taking ms contin 30 mg q 12 hours and percocet 5 tablets per day. She reports she is coughing with yellow sputum and recently had bronchitis. She finished Levaquin last night and will finish prednisone today. She reports feeling short of breath. She is using 2.5 liters of oxygen at home. She reports having fatigue. She reports having a good appetite but has lost 20 lbs since her last visit in May.States she eats mostly leafy greens, starting to drink Ensure. Denies hemoptysis.                                ALLERGIES:  is allergic to penicillins; procaine; codeine; ivp dye; and povidone-iodine.  Meds: Current Outpatient Prescriptions  Medication Sig Dispense Refill  . albuterol (PROVENTIL HFA;VENTOLIN HFA) 108 (90 BASE) MCG/ACT inhaler Inhale 2 puffs into the lungs daily as needed for wheezing. Refill called in to [place], [place] per Dr. Sondra Come. 6.7 g 3  . albuterol (PROVENTIL) (2.5 MG/3ML) 0.083% nebulizer solution Take 2.5 mg by nebulization every 6 (six) hours as needed for wheezing.    Marland Kitchen arformoterol (BROVANA) 15 MCG/2ML NEBU     . aspirin EC 81 MG tablet     .  atorvastatin (LIPITOR) 40 MG tablet Take 40 mg by mouth daily.    Marland Kitchen azelastine (ASTELIN) 137 MCG/SPRAY nasal spray Place 2 sprays into the nose daily. Use in each nostril as directed    . budesonide (PULMICORT) 0.5 MG/2ML nebulizer solution Take 0.5 mg by nebulization 2 (two) times daily.    . cetirizine (ZYRTEC) 10 MG tablet Take 10 mg by mouth daily.    . Cholecalciferol (VITAMIN D) 2000 UNITS CAPS Take by mouth daily.    Marland Kitchen gabapentin (NEURONTIN) 600 MG tablet Take 600 mg by mouth 2 (two) times daily.    Javier Docker Oil 500 MG CAPS Take by mouth daily.    Marland Kitchen levothyroxine (SYNTHROID, LEVOTHROID) 112 MCG tablet Take 112 mcg by mouth daily before breakfast.    . meclizine (ANTIVERT) 25 MG tablet Take 25 mg by mouth 3 (three) times daily.    Marland Kitchen morphine (MS CONTIN) 30 MG 12 hr tablet   0  . Multiple Vitamin (MULTIVITAMIN) tablet Take 1 tablet by mouth daily.    Marland Kitchen oxyCODONE-acetaminophen (PERCOCET) 10-325 MG per tablet Take 1 tablet by mouth every 4 (four) hours as needed for pain.    . polyethylene glycol powder (GLYCOLAX/MIRALAX) powder     . predniSONE (DELTASONE) 10 MG tablet   0  . vitamin C (ASCORBIC ACID)  500 MG tablet Take 500 mg by mouth daily.    . vitamin E 1000 UNIT capsule Take 1,000 Units by mouth daily.    . celecoxib (CELEBREX) 200 MG capsule Take 200 mg by mouth. Reported on 08/22/2015    . diclofenac (FLECTOR) 1.3 % PTCH Place 1 patch onto the skin 2 (two) times daily. Reported on 08/22/2015    . diclofenac sodium (VOLTAREN) 1 % GEL Reported on 08/22/2015    . Ipratropium-Albuterol (COMBIVENT) 20-100 MCG/ACT AERS respimat Inhale 1 puff into the lungs every 6 (six) hours. Reported on 08/22/2015    . levofloxacin (LEVAQUIN) 750 MG tablet Take 750 mg by mouth daily. Reported on 08/22/2015    . PREDNISONE PO Take 10 mg by mouth daily. Reported on 08/22/2015    . Probiotic Product (SOLUBLE FIBER/PROBIOTICS PO) Take 1 tablet by mouth daily. Reported on 08/22/2015    . SPIRIVA RESPIMAT 2.5 MCG/ACT  AERS   0   No current facility-administered medications for this encounter.    Physical Findings: The patient is in no acute distress. Patient is alert and oriented.  height is '5\' 3"'$  (1.6 m) and weight is 109 lb 8 oz (49.669 kg). Her oral temperature is 98.5 F (36.9 C). Her blood pressure is 169/60 and her pulse is 85. Her oxygen saturation is 97%. .  No significant changes. No palpable cervical, supraclavicular or axillary lymphoadenopathy. The heart has a regular rate and rhythm. Wheezing in right lung. The patient presents in a wheelchair and is on 2.5L of supplemental oxygen.   Lab Findings: No results found for: WBC, HGB, HCT, MCV, PLT  Radiographic Findings: Chest CT scan to be performed tonight No results found.  Impression:   She is clinical Stage I s/p SBRT for right side   non-small cell lung cancer. Most recent chest CT scan shows no evidence of recurrence .The patient will proceed with Chest CT today   Plan:  Routine follow up in 6 months.  -----------------------------------------------  Blair Promise, PhD, MD  This document serves as a record of services personally performed by Gery Pray, MD. It was created on his behalf by Derek Mound, a trained medical scribe. The creation of this record is based on the scribe's personal observations and the provider's statements to them. This document has been checked and approved by the attending provider.

## 2015-08-22 NOTE — Progress Notes (Signed)
Jenny Howe here for follow up.  She continues to report having pain in her back.  She is rating it at a 8/10 today.  She is taking ms contin 30 mg q 12 hours and percocet 5 tablets per day.  She reports she is coughing with yellow sputum and recently had bronchitis.  She finished Levaquin last night and will finish prednisone today.  She reports feeling short of breath.  She is using 2.5 liters of oxygen at home.  She reports having fatigue.  She reports having a good appetite but has lost 20 lbs since her last visit in May.  BP 169/60 mmHg  Pulse 85  Temp(Src) 98.5 F (36.9 C) (Oral)  Ht '5\' 3"'$  (1.6 m)  Wt 109 lb 8 oz (49.669 kg)  BMI 19.40 kg/m2  SpO2 97%   Wt Readings from Last 3 Encounters:  08/22/15 109 lb 8 oz (49.669 kg)  10/18/14 129 lb 8 oz (58.741 kg)  04/25/14 125 lb 12.8 oz (57.063 kg)

## 2015-08-26 ENCOUNTER — Telehealth: Payer: Self-pay | Admitting: *Deleted

## 2015-08-26 ENCOUNTER — Other Ambulatory Visit: Payer: Self-pay | Admitting: Radiation Oncology

## 2015-08-26 DIAGNOSIS — C3411 Malignant neoplasm of upper lobe, right bronchus or lung: Secondary | ICD-10-CM

## 2015-08-26 NOTE — Telephone Encounter (Signed)
CALLED PATIENT TO INFORM OF CT ON 02-27-16 @ 9 AM @ [facility] AND FU WITH DR. KINARD ON10-5-17 @ 11:45 AM, SPOKE WITH PATIENT AND SHE IS AWARE OF THESE APPTS.

## 2016-02-21 ENCOUNTER — Telehealth: Payer: Self-pay | Admitting: Oncology

## 2016-02-21 ENCOUNTER — Telehealth: Payer: Self-pay | Admitting: *Deleted

## 2016-02-21 NOTE — Telephone Encounter (Signed)
Jenny Howe called and asked if a CT scan can be ordered closer to her home because she does not have gas money right now.  She said her doctor at home is Dr. Truman Hayward at [phone].  She also said she needs to reschedule her appointment for 02/27/16 with Dr. Sondra Come and reschedule it for November.  [name], Medical Secretary notified of requested schedule change.

## 2016-02-21 NOTE — Telephone Encounter (Signed)
"  I'm a patient of Dr. Sondra Come.  I'm scheduled for scans October 5th and would like to have these in [place]."  Call transferred Radiation Oncology ext. [phone].

## 2016-02-27 ENCOUNTER — Ambulatory Visit: Payer: Self-pay | Admitting: Radiation Oncology

## 2016-02-27 ENCOUNTER — Ambulatory Visit (HOSPITAL_COMMUNITY): Payer: Medicare Other

## 2016-03-11 ENCOUNTER — Telehealth: Payer: Self-pay | Admitting: Oncology

## 2016-03-11 NOTE — Telephone Encounter (Signed)
Jenny Howe called and wanted to make sure she will have a CT scan before she sees Dr. Sondra Come on 12/14.  Advised her that the CT scan has been ordered and is not able to be scheduled until next month.  Mirai verbalized understanding and agreement.

## 2016-05-07 ENCOUNTER — Telehealth: Payer: Self-pay | Admitting: *Deleted

## 2016-05-07 ENCOUNTER — Ambulatory Visit: Payer: Medicare Other | Admitting: Radiation Oncology

## 2016-05-07 NOTE — Telephone Encounter (Signed)
Called patient to reschedule missed test and fu, lvm for a return call

## 2016-10-23 ENCOUNTER — Telehealth: Payer: Self-pay | Admitting: *Deleted

## 2016-10-23 NOTE — Telephone Encounter (Signed)
Called patient to give her info., no answer will call later

## 2016-12-10 ENCOUNTER — Other Ambulatory Visit: Payer: Self-pay | Admitting: Oncology

## 2016-12-10 DIAGNOSIS — C3411 Malignant neoplasm of upper lobe, right bronchus or lung: Secondary | ICD-10-CM

## 2016-12-11 ENCOUNTER — Telehealth: Payer: Self-pay | Admitting: *Deleted

## 2016-12-11 NOTE — Telephone Encounter (Signed)
Called patient to inform of Ct for 12-14-16- arrival time - 2:45 pm @ [facility], no restrictions to test, spoke with patient and she is aware of this test

## 2016-12-14 ENCOUNTER — Ambulatory Visit (HOSPITAL_COMMUNITY)
Admission: RE | Admit: 2016-12-14 | Discharge: 2016-12-14 | Disposition: A | Discharge status: Home / Self Care | Payer: Medicare Other | Source: Ambulatory Visit | Attending: Radiation Oncology | Admitting: Radiation Oncology

## 2016-12-14 ENCOUNTER — Ambulatory Visit
Admission: RE | Admit: 2016-12-14 | Discharge: 2016-12-14 | Disposition: A | Discharge status: Home / Self Care | Payer: Medicare Other | Source: Ambulatory Visit | Attending: Radiation Oncology | Admitting: Radiation Oncology

## 2016-12-14 DIAGNOSIS — Z923 Personal history of irradiation: Secondary | ICD-10-CM | POA: Insufficient documentation

## 2016-12-14 DIAGNOSIS — C3411 Malignant neoplasm of upper lobe, right bronchus or lung: Secondary | ICD-10-CM | POA: Diagnosis not present

## 2016-12-14 DIAGNOSIS — Z79899 Other long term (current) drug therapy: Secondary | ICD-10-CM | POA: Insufficient documentation

## 2016-12-14 DIAGNOSIS — I2510 Atherosclerotic heart disease of native coronary artery without angina pectoris: Secondary | ICD-10-CM | POA: Diagnosis not present

## 2016-12-14 DIAGNOSIS — J849 Interstitial pulmonary disease, unspecified: Secondary | ICD-10-CM | POA: Diagnosis not present

## 2016-12-14 DIAGNOSIS — I700 Atherosclerosis of aorta: Secondary | ICD-10-CM | POA: Insufficient documentation

## 2016-12-14 NOTE — Progress Notes (Addendum)
Radiation Oncology         (336) [phone] ________________________________  Name: Jenny Howe MRN: [id number]  Date: 12/14/2016  DOB: 12-18-1945  Follow-Up Visit Note  CC: Helen Hashimoto., MD  [name], MD    ICD-10-CM   1. Malignant neoplasm of upper lobe of right lung [facility]) C34.11     Diagnosis:   Malignant neoplasm of upper lobe of right lung [facility]) Squamous cell carcinoma  Radiation treatment dates:   September 3, September 8, February 02, 2013 Site/dose:   Right upper lobe nodule, 54 gray in 3 fractions (SBRT) Interval Since Last Radiation:  3 years, 4 months  Narrative:  The patient returns today for routine follow-up. She continues to live down near [employer] and drive here for follow-up and chest CT scan.    She reports having shortness of breath especially with activity.  She is using 2L of oxygen and also uses a nebulizer.  She denies having a cough or hemoptysis.  She reports feeling fatigued.  She has recently been hospitalized in February and June with pneumonia and copd.  She reports having pain from arthritis in her back and legs. She takes morphine and percocet as needed.                   ALLERGIES:  is allergic to penicillins; procaine; codeine; ivp dye [iodinated diagnostic agents]; and povidone-iodine [povidone iodine].  Meds: Current Outpatient Prescriptions  Medication Sig Dispense Refill  . albuterol (PROVENTIL HFA;VENTOLIN HFA) 108 (90 BASE) MCG/ACT inhaler Inhale 2 puffs into the lungs daily as needed for wheezing. Refill called in to [place], [place] per Dr. Sondra Come. 6.7 g 3  . albuterol (PROVENTIL) (2.5 MG/3ML) 0.083% nebulizer solution Take 2.5 mg by nebulization every 6 (six) hours as needed for wheezing.    Marland Kitchen arformoterol (BROVANA) 15 MCG/2ML NEBU     . aspirin EC 81 MG tablet     . atorvastatin (LIPITOR) 40 MG tablet Take 40 mg by mouth daily.    Marland Kitchen azelastine (ASTELIN) 137 MCG/SPRAY nasal spray Place 2 sprays into the nose  daily. Use in each nostril as directed    . cetirizine (ZYRTEC) 10 MG tablet Take 10 mg by mouth daily.    . Cholecalciferol (VITAMIN D) 2000 UNITS CAPS Take by mouth daily.    Marland Kitchen gabapentin (NEURONTIN) 600 MG tablet Take 600 mg by mouth 2 (two) times daily.    . Ipratropium-Albuterol (COMBIVENT) 20-100 MCG/ACT AERS respimat Inhale 1 puff into the lungs every 6 (six) hours. Reported on 08/22/2015    . Krill Oil 500 MG CAPS Take by mouth daily.    Marland Kitchen levothyroxine (SYNTHROID, LEVOTHROID) 112 MCG tablet Take 112 mcg by mouth daily before breakfast.    . morphine (MS CONTIN) 30 MG 12 hr tablet   0  . Multiple Vitamin (MULTIVITAMIN) tablet Take 1 tablet by mouth daily.    Marland Kitchen oxyCODONE-acetaminophen (PERCOCET) 10-325 MG per tablet Take 1 tablet by mouth every 4 (four) hours as needed for pain.    Marland Kitchen PREDNISONE PO Take 10 mg by mouth daily. Reported on 08/22/2015    . vitamin C (ASCORBIC ACID) 500 MG tablet Take 500 mg by mouth daily.    . vitamin E 1000 UNIT capsule Take 1,000 Units by mouth daily.    . budesonide (PULMICORT) 0.5 MG/2ML nebulizer solution Take 0.5 mg by nebulization 2 (two) times daily.    . celecoxib (CELEBREX) 200 MG capsule Take 200 mg  by mouth. Reported on 08/22/2015    . diclofenac (FLECTOR) 1.3 % PTCH Place 1 patch onto the skin 2 (two) times daily. Reported on 08/22/2015    . diclofenac sodium (VOLTAREN) 1 % GEL Reported on 08/22/2015    . levofloxacin (LEVAQUIN) 750 MG tablet Take 750 mg by mouth daily. Reported on 08/22/2015    . meclizine (ANTIVERT) 25 MG tablet Take 25 mg by mouth 3 (three) times daily.    . naproxen sodium (ANAPROX) 220 MG tablet Take by mouth.    . predniSONE (DELTASONE) 10 MG tablet   0  . Probiotic Product (SOLUBLE FIBER/PROBIOTICS PO) Take 1 tablet by mouth daily. Reported on 08/22/2015    . SPIRIVA RESPIMAT 2.5 MCG/ACT AERS   0   No current facility-administered medications for this encounter.     Physical Findings: The patient is in no acute distress.  Patient is alert and oriented.  height is 5\' 3"  (1.6 m) and weight is 95 lb 12.8 oz (43.5 kg). Her oral temperature is 98.4 F (36.9 C). Her blood pressure is 144/62 (abnormal) and her pulse is 57 (abnormal). Her oxygen saturation is 100%. . The lungs are clear except for some mild wheezing. The heart has a regular rhythm and rate. No palpable subclavicular or axillary adenopathy. Supplemental oxygen in place, 2 L Lab Findings: No results found for: WBC, HGB, HCT, MCV, PLT  Radiographic Findings: No results found.  Impression:  No evidence of recurrence on clinical exam today.  Plan:  Patient will present to [facility] this evening for Chest CT Scan without Contrast. Routine Follow-Up in 6 Months  ____________________________________   Blair Promise, PhD, MD This document serves as a record of services personally performed by Gery Pray, MD. It was created on his behalf by Valeta Harms, a trained medical scribe. The creation of this record is based on the scribe's personal observations and the provider's statements to them. This document has been checked and approved by the attending provider.

## 2016-12-14 NOTE — Progress Notes (Addendum)
Jenny Howe is here for follow up.  She reports having shortness of breath especially with activity.  She is using 2L of oxygen and also uses a nebulizer.  She denies having a cough or hemoptysis.  She reports feeling fatigued.  She has recently been hospitalized in February and June with pneumonia and copd.  She was not able to get her CT scan today.  She waited for an hour and was never called back.  She reports having pain from arthritis in her back and legs. She takes morphine and percocet as needed.  BP (!) 144/62 (BP Location: Left Arm, Patient Position: Sitting)   Pulse (!) 57   Temp 98.4 F (36.9 C) (Oral)   Ht 5\' 3"  (1.6 m)   Wt 95 lb 12.8 oz (43.5 kg)   SpO2 100%   BMI 16.97 kg/m    Wt Readings from Last 3 Encounters:  12/14/16 95 lb 12.8 oz (43.5 kg)  08/22/15 109 lb 8 oz (49.7 kg)  10/18/14 129 lb 8 oz (58.7 kg)

## 2016-12-15 ENCOUNTER — Telehealth: Payer: Self-pay | Admitting: Oncology

## 2016-12-15 NOTE — Telephone Encounter (Signed)
Called Jenny Howe and left a message regarding the good results of her CT Scan.  Requested a return call.  Also tried to call daughter's phone and the message said the number was for someone else.

## 2016-12-15 NOTE — Telephone Encounter (Signed)
Azuri's daughter, [name], called back and was advised of the CT results.  She verbalized understanding and agreement.

## 2017-06-21 ENCOUNTER — Other Ambulatory Visit: Payer: Self-pay

## 2017-06-21 ENCOUNTER — Ambulatory Visit
Admission: RE | Admit: 2017-06-21 | Discharge: 2017-06-21 | Disposition: A | Discharge status: Home / Self Care | Payer: Medicare PPO | Source: Ambulatory Visit | Attending: Radiation Oncology | Admitting: Radiation Oncology

## 2017-06-21 DIAGNOSIS — C3411 Malignant neoplasm of upper lobe, right bronchus or lung: Secondary | ICD-10-CM | POA: Diagnosis present

## 2017-06-21 DIAGNOSIS — I2510 Atherosclerotic heart disease of native coronary artery without angina pectoris: Secondary | ICD-10-CM | POA: Diagnosis not present

## 2017-06-21 DIAGNOSIS — Z880 Allergy status to penicillin: Secondary | ICD-10-CM | POA: Insufficient documentation

## 2017-06-21 DIAGNOSIS — Z7982 Long term (current) use of aspirin: Secondary | ICD-10-CM | POA: Diagnosis not present

## 2017-06-21 DIAGNOSIS — Z888 Allergy status to other drugs, medicaments and biological substances status: Secondary | ICD-10-CM | POA: Insufficient documentation

## 2017-06-21 DIAGNOSIS — Z79899 Other long term (current) drug therapy: Secondary | ICD-10-CM | POA: Diagnosis not present

## 2017-06-21 DIAGNOSIS — Z885 Allergy status to narcotic agent status: Secondary | ICD-10-CM | POA: Diagnosis not present

## 2017-06-21 NOTE — Progress Notes (Signed)
Radiation Oncology         (336) [phone] ________________________________  Name: Jenny Howe MRN: [id number]  Date: 06/21/2017  DOB: 15-Jul-1945  Follow-Up Visit Note  CC: System, [place] Not In  [name], MD    ICD-10-CM   1. Malignant neoplasm of upper lobe of right lung [facility]) C34.11     Diagnosis:   Malignant neoplasm of upper lobe of right lung ([place]) Squamous cell carcinoma  Radiation treatment dates:   September 3, September 8, February 02, 2013 Site/dose:   Right upper lobe nodule, 54 gray in 3 fractions (SBRT) Interval Since Last Radiation:  4 years, 4 months, 17 days  Narrative:  The patient returns today for routine follow-up. She is accompanied today by her husband and her daughter.  Pt was admitted to the hospital in September 2018 for adverse side effects of gabapentin. She is still using 2.5 L of oxygen and increased to 3 L with ambulation. She notes that she recently had a cold that was treated with Rx doxycyline as well as prednisone.  She hasn't gotten her flu or pneumonia vaccination this past year (says she is not able to have these)  Since her last visit to the office, she underwent CT chest without contrast on 12/14/2016 with results showing: IMPRESSION: Stable examination demonstrating post radiation changes in the right upper lobe. No specific findings identified to suggest local tumor recurrence or thoracic metastases. 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 Aortic atherosclerosis and multi vessel coronary artery calcification.  On review of systems, she reports chronic HA, lower and mid back pain, and productive cough x yellow/white sputum . She denies hemoptysis and any other symptoms.                  ALLERGIES:  is allergic to penicillins; pneumococcal vaccine; procaine; codeine; ivp dye [iodinated diagnostic agents]; and povidone-iodine [povidone iodine].  Meds: Current Outpatient Medications   Medication Sig Dispense Refill  . albuterol (PROVENTIL HFA;VENTOLIN HFA) 108 (90 BASE) MCG/ACT inhaler Inhale 2 puffs into the lungs daily as needed for wheezing. Refill called in to [place], [place] per Dr. Sondra Come. 6.7 g 3  . albuterol (PROVENTIL) (2.5 MG/3ML) 0.083% nebulizer solution Take 2.5 mg by nebulization every 6 (six) hours as needed for wheezing.    Marland Kitchen arformoterol (BROVANA) 15 MCG/2ML NEBU     . aspirin EC 81 MG tablet     . atorvastatin (LIPITOR) 40 MG tablet Take 40 mg by mouth daily.    . cetirizine (ZYRTEC) 10 MG tablet Take 10 mg by mouth daily.    . Cholecalciferol (VITAMIN D) 2000 UNITS CAPS Take by mouth daily.    Marland Kitchen doxycycline (VIBRAMYCIN) 100 MG capsule TK 1 C PO BID  0  . gabapentin (NEURONTIN) 600 MG tablet Take 600 mg by mouth 2 (two) times daily.    Javier Docker Oil 500 MG CAPS Take by mouth daily.    Marland Kitchen levothyroxine (SYNTHROID, LEVOTHROID) 112 MCG tablet Take 112 mcg by mouth daily before breakfast.    . meclizine (ANTIVERT) 25 MG tablet Take 25 mg by mouth 3 (three) times daily.    Marland Kitchen morphine (MS CONTIN) 30 MG 12 hr tablet   0  . Multiple Vitamin (MULTIVITAMIN) tablet Take 1 tablet by mouth daily.    Marland Kitchen oxyCODONE-acetaminophen (PERCOCET) 10-325 MG per tablet Take 1 tablet by mouth every 4 (four) hours as needed for pain.    . predniSONE (DELTASONE) 10  MG tablet   0  . PREDNISONE PO Take 10 mg by mouth daily. Reported on 08/22/2015    . Probiotic Product (SOLUBLE FIBER/PROBIOTICS PO) Take 1 tablet by mouth daily. Reported on 08/22/2015    . TRELEGY ELLIPTA 100-62.5-25 MCG/INH AEPB INL 1 PUFF PO AT THE SAME TIME QD  11  . vitamin C (ASCORBIC ACID) 500 MG tablet Take 500 mg by mouth daily.    . vitamin E 1000 UNIT capsule Take 1,000 Units by mouth daily.    Marland Kitchen azelastine (ASTELIN) 137 MCG/SPRAY nasal spray Place 2 sprays into the nose daily. Use in each nostril as directed    . budesonide (PULMICORT) 0.5 MG/2ML nebulizer solution Take 0.5 mg by nebulization 2 (two)  times daily.    . celecoxib (CELEBREX) 200 MG capsule Take 200 mg by mouth. Reported on 08/22/2015    . diclofenac (FLECTOR) 1.3 % PTCH Place 1 patch onto the skin 2 (two) times daily. Reported on 08/22/2015    . diclofenac sodium (VOLTAREN) 1 % GEL Reported on 08/22/2015    . Ipratropium-Albuterol (COMBIVENT) 20-100 MCG/ACT AERS respimat Inhale 1 puff into the lungs every 6 (six) hours. Reported on 08/22/2015    . SPIRIVA RESPIMAT 2.5 MCG/ACT AERS   0   No current facility-administered medications for this encounter.     Physical Findings: The patient is in no acute distress. Patient is alert and oriented.  height is 5\' 3"  (1.6 m) and weight is 99 lb 6.4 oz (45.1 kg). Her oral temperature is 98.2 F (36.8 C). Her blood pressure is 146/69 (abnormal) and her pulse is 74. Her oxygen saturation is 98%. . The lungs are clear except for some mild wheezing. The heart has a regular rhythm and rate. No palpable supraclavicular or axillary adenopathy. Supplemental oxygen in place, 2.5 L  Lab Findings: No results found for: WBC, HGB, HCT, MCV, PLT  Radiographic Findings: No results found.  Impression:  No evidence of recurrence on clinical exam today.  Plan:  Patient will present to [facility] this evening or tomorrow for Chest CT Scan without Contrast. Routine Follow-Up in 6 Months. She will return [facility] the [facility] area after completion of her chest CT scan.   ____________________________________   Blair Promise, PhD, MD  This document serves as a record of services personally performed by Gery Pray, MD. It was created on his behalf by [facility], a trained medical scribe. The creation of this record is based on the scribe's personal observations and the provider's statements to them. This document has been checked and approved by the attending provider.

## 2017-06-21 NOTE — Progress Notes (Signed)
Jenny Howe is here for follow up.  She continues to have back pain and takes morphine q 12 hours and percocet for breakthrough. She said she has a cold and is on doxycyline until Thursday and prednisone 10 mg.  She reports having more shortness of breath since she has been sick.  She reports having a productive cough with white/yellow sputum.  She denies having any hemoptysis.  She is using 2.5 L of oxygen as needed.  She reports feeling fatigued.  She said her appetite has improved and is drinking ensure and boost.  BP (!) 146/69 (BP Location: Left Arm, Patient Position: Sitting)   Pulse 74   Temp 98.2 F (36.8 C) (Oral)   Ht 5\' 3"  (1.6 m)   Wt 99 lb 6.4 oz (45.1 kg)   SpO2 98% Comment: 2L of oxygen  BMI 17.61 kg/m   Wt Readings from Last 3 Encounters:  06/21/17 99 lb 6.4 oz (45.1 kg)  12/14/16 95 lb 12.8 oz (43.5 kg)  08/22/15 109 lb 8 oz (49.7 kg)

## 2017-06-22 ENCOUNTER — Ambulatory Visit (HOSPITAL_COMMUNITY): Payer: Medicare PPO

## 2017-06-22 ENCOUNTER — Telehealth: Payer: Self-pay | Admitting: *Deleted

## 2017-06-22 ENCOUNTER — Ambulatory Visit (HOSPITAL_COMMUNITY)
Admission: RE | Admit: 2017-06-22 | Discharge: 2017-06-22 | Disposition: A | Discharge status: Home / Self Care | Payer: Medicare PPO | Source: Ambulatory Visit | Attending: Radiation Oncology | Admitting: Radiation Oncology

## 2017-06-22 DIAGNOSIS — C3411 Malignant neoplasm of upper lobe, right bronchus or lung: Secondary | ICD-10-CM | POA: Insufficient documentation

## 2017-06-22 DIAGNOSIS — I700 Atherosclerosis of aorta: Secondary | ICD-10-CM | POA: Insufficient documentation

## 2017-06-22 DIAGNOSIS — J849 Interstitial pulmonary disease, unspecified: Secondary | ICD-10-CM | POA: Insufficient documentation

## 2017-06-22 NOTE — Telephone Encounter (Signed)
CALLED PATIENT'S DAUGHTER [name] RAY TO INFORM OF CT FOR 06-22-17 - ARRIVAL TIME - 2:45 PM , NO RESTRICTIONS TO TEST, TEST TO BE @ [facility], LVM FOR A RETURN CALL

## 2017-06-23 ENCOUNTER — Telehealth: Payer: Self-pay | Admitting: Oncology

## 2017-06-23 NOTE — Telephone Encounter (Signed)
Patient left a message asking for the results of her CT scan from yesterday.  She requested a call back at [phone].

## 2017-06-23 NOTE — Telephone Encounter (Signed)
Called patient back and advised her of CT results per Dr. Sondra Come.  She verbalized understanding.

## 2017-06-29 ENCOUNTER — Telehealth: Payer: Self-pay | Admitting: *Deleted

## 2017-06-29 NOTE — Telephone Encounter (Signed)
CALLED PATIENT TO INFORM OF FU APPT. ON 12-20-17 @ 3:45 PM , SPOKE WITH PATIENT AND SHE IS AWARE OF THIS APPT.

## 2017-12-20 ENCOUNTER — Ambulatory Visit: Admission: RE | Admit: 2017-12-20 | Payer: Medicare Other | Source: Ambulatory Visit | Admitting: Radiation Oncology

## 2018-01-31 ENCOUNTER — Ambulatory Visit: Admission: RE | Admit: 2018-01-31 | Payer: Medicare PPO | Source: Ambulatory Visit | Admitting: Radiation Oncology

## 2018-03-21 ENCOUNTER — Ambulatory Visit
Admission: RE | Admit: 2018-03-21 | Discharge: 2018-03-21 | Disposition: A | Discharge status: Home / Self Care | Payer: Medicare PPO | Source: Ambulatory Visit | Attending: Radiation Oncology | Admitting: Radiation Oncology

## 2018-03-21 ENCOUNTER — Other Ambulatory Visit: Payer: Self-pay

## 2018-03-21 ENCOUNTER — Other Ambulatory Visit: Payer: Self-pay | Admitting: Radiation Oncology

## 2018-03-21 ENCOUNTER — Ambulatory Visit (HOSPITAL_COMMUNITY)
Admission: RE | Admit: 2018-03-21 | Discharge: 2018-03-21 | Disposition: A | Discharge status: Home / Self Care | Payer: Medicare PPO | Source: Ambulatory Visit | Attending: Radiation Oncology | Admitting: Radiation Oncology

## 2018-03-21 ENCOUNTER — Encounter: Payer: Self-pay | Admitting: Radiation Oncology

## 2018-03-21 VITALS — BP 127/57 | HR 90 | Temp 99.1°F | Resp 20 | Wt 110.2 lb

## 2018-03-21 DIAGNOSIS — Z885 Allergy status to narcotic agent status: Secondary | ICD-10-CM | POA: Insufficient documentation

## 2018-03-21 DIAGNOSIS — I700 Atherosclerosis of aorta: Secondary | ICD-10-CM | POA: Insufficient documentation

## 2018-03-21 DIAGNOSIS — C3490 Malignant neoplasm of unspecified part of unspecified bronchus or lung: Secondary | ICD-10-CM

## 2018-03-21 DIAGNOSIS — Z888 Allergy status to other drugs, medicaments and biological substances status: Secondary | ICD-10-CM | POA: Diagnosis not present

## 2018-03-21 DIAGNOSIS — Z923 Personal history of irradiation: Secondary | ICD-10-CM | POA: Insufficient documentation

## 2018-03-21 DIAGNOSIS — Z880 Allergy status to penicillin: Secondary | ICD-10-CM | POA: Insufficient documentation

## 2018-03-21 DIAGNOSIS — Z79899 Other long term (current) drug therapy: Secondary | ICD-10-CM | POA: Insufficient documentation

## 2018-03-21 DIAGNOSIS — C3411 Malignant neoplasm of upper lobe, right bronchus or lung: Secondary | ICD-10-CM

## 2018-03-21 DIAGNOSIS — J432 Centrilobular emphysema: Secondary | ICD-10-CM

## 2018-03-21 DIAGNOSIS — R911 Solitary pulmonary nodule: Secondary | ICD-10-CM | POA: Insufficient documentation

## 2018-03-21 DIAGNOSIS — J438 Other emphysema: Secondary | ICD-10-CM

## 2018-03-21 DIAGNOSIS — Z7982 Long term (current) use of aspirin: Secondary | ICD-10-CM | POA: Insufficient documentation

## 2018-03-21 NOTE — Addendum Note (Signed)
Encounter addended by: Gery Pray, MD on: 03/21/2018 6:56 PM  Actions taken: Order list changed, Diagnosis association updated

## 2018-03-21 NOTE — Progress Notes (Addendum)
Ms. Yager is here for a follow-up appointment today. Patient denies any pain related to her treatment site. Patient states that her fatigue level is serve. Patient states that she get shortness of breath with activity and at rest.. Patient states that she is on 2.5 liters of oxygen.  Patient denies any difficulty with swallowing. Patient denies any skin issues. Patient states that her appeitie is good.  Vitals:   03/21/18 1528  BP: (!) 127/57  Pulse: 90  Resp: 20  Temp: 99.1 F (37.3 C)  TempSrc: Oral  SpO2: 97%  Weight: 110 lb 3.2 oz (50 kg)   Wt Readings from Last 3 Encounters:  03/21/18 110 lb 3.2 oz (50 kg)  06/21/17 99 lb 6.4 oz (45.1 kg)  12/14/16 95 lb 12.8 oz (43.5 kg)

## 2018-03-21 NOTE — Progress Notes (Signed)
Radiation Oncology         [phone] ________________________________  Name: Jenny Howe MRN: [id number]  Date: 03/21/2018  DOB: 11-Sep-1945  Follow-Up Visit Note  CC: System, [place] Not In  Jenny Potter, MD    ICD-10-CM   1. Malignant neoplasm of upper lobe of right lung [facility]) C34.11     Diagnosis:   Malignant neoplasm of upper lobe of right lung ([place]) Squamous cell carcinoma  Radiation treatment dates:   September 3, September 8, February 02, 2013 Site/dose:   Right upper lobe nodule, 54 gray in 3 fractions (SBRT) Interval Since Last Radiation:  5 years, 1 months, 17 days  Narrative:  The patient returns today fo routine follow-up. She is accompanied today by two daughters.   Since her last visit to the office, she had a CT chest without contrast on 06/22/17 that showed: No interval change.  No evidence lung cancer recurrence. Stable postherapy changes in lungs with mild interstitial lung disease. Aortic Atherosclerosis (ICD10-I70.0).   On review of systems, she denies back pain, spitting up blood, pain in her chest, and all other symptoms. She is due for another CT chest w/o contrast. She remains on supplemental oxygen 2.5 L. She occasionally will bump up to 3 L with activity.                  ALLERGIES:  is allergic to penicillins; pneumococcal vaccine; procaine; codeine; ivp dye [iodinated diagnostic agents]; and povidone-iodine [povidone iodine].  Meds: Current Outpatient Medications  Medication Sig Dispense Refill  . albuterol (PROVENTIL HFA;VENTOLIN HFA) 108 (90 BASE) MCG/ACT inhaler Inhale 2 puffs into the lungs daily as needed for wheezing. Refill called in to [place], [place] per Dr. Sondra Come. 6.7 g 3  . albuterol (PROVENTIL) (2.5 MG/3ML) 0.083% nebulizer solution Take 2.5 mg by nebulization every 6 (six) hours as needed for wheezing.    Marland Kitchen arformoterol (BROVANA) 15 MCG/2ML NEBU     . aspirin EC 81 MG tablet     . atorvastatin (LIPITOR) 40 MG tablet  Take 40 mg by mouth daily.    Marland Kitchen azelastine (ASTELIN) 137 MCG/SPRAY nasal spray Place 2 sprays into the nose daily. Use in each nostril as directed    . budesonide (PULMICORT) 0.5 MG/2ML nebulizer solution Take 0.5 mg by nebulization 2 (two) times daily.    . celecoxib (CELEBREX) 200 MG capsule Take 200 mg by mouth. Reported on 08/22/2015    . cetirizine (ZYRTEC) 10 MG tablet Take 10 mg by mouth daily.    . Cholecalciferol (VITAMIN D) 2000 UNITS CAPS Take by mouth daily.    Marland Kitchen doxycycline (VIBRAMYCIN) 100 MG capsule TK 1 C PO BID  0  . gabapentin (NEURONTIN) 600 MG tablet Take 600 mg by mouth 2 (two) times daily.    . Ipratropium-Albuterol (COMBIVENT) 20-100 MCG/ACT AERS respimat Inhale 1 puff into the lungs every 6 (six) hours. Reported on 08/22/2015    . Krill Oil 500 MG CAPS Take by mouth daily.    Marland Kitchen levothyroxine (SYNTHROID, LEVOTHROID) 112 MCG tablet Take 112 mcg by mouth daily before breakfast.    . meclizine (ANTIVERT) 25 MG tablet Take 25 mg by mouth 3 (three) times daily.    . Multiple Vitamin (MULTIVITAMIN) tablet Take 1 tablet by mouth daily.    Marland Kitchen oxyCODONE-acetaminophen (PERCOCET) 10-325 MG per tablet Take 1 tablet by mouth every 4 (four) hours as needed for pain.    . TRELEGY ELLIPTA 100-62.5-25 MCG/INH AEPB INL 1  PUFF PO AT THE SAME TIME QD  11  . vitamin C (ASCORBIC ACID) 500 MG tablet Take 500 mg by mouth daily.    . vitamin E 1000 UNIT capsule Take 1,000 Units by mouth daily.    . diclofenac (FLECTOR) 1.3 % PTCH Place 1 patch onto the skin 2 (two) times daily. Reported on 08/22/2015    . diclofenac sodium (VOLTAREN) 1 % GEL Reported on 08/22/2015    . morphine (MS CONTIN) 30 MG 12 hr tablet   0  . predniSONE (DELTASONE) 10 MG tablet   0  . PREDNISONE PO Take 10 mg by mouth daily. Reported on 08/22/2015    . Probiotic Product (SOLUBLE FIBER/PROBIOTICS PO) Take 1 tablet by mouth daily. Reported on 08/22/2015    . SPIRIVA RESPIMAT 2.5 MCG/ACT AERS   0   No current facility-administered  medications for this encounter.     Physical Findings: The patient is in no acute distress. Patient is alert and oriented.  weight is 110 lb 3.2 oz (50 kg). Her oral temperature is 99.1 F (37.3 C). Her blood pressure is 127/57 (abnormal) and her pulse is 90. Her respiration is 20 and oxygen saturation is 97%. . The lungs are clear , no wheezing noted today. The heart has a regular rhythm and rate. No palpable supraclavicular or axillary adenopathy. Supplemental oxygen in place, 2.5 L  Lab Findings: No results found for: WBC, HGB, HCT, MCV, PLT  Radiographic Findings: Ct Chest Wo Contrast  Result Date: 03/21/2018 CLINICAL DATA:  Non-small-cell lung cancer diagnosed in 2013. Radiation therapy completed. EXAM: CT CHEST WITHOUT CONTRAST TECHNIQUE: Multidetector CT imaging of the chest was performed following the standard protocol without IV contrast. COMPARISON:  Chest CT 06/22/2017 and 12/14/2016. FINDINGS: Cardiovascular: At least moderate atherosclerosis of the aorta, great vessels and coronary arteries. There is mild central enlargement of the pulmonary arteries. The heart size is normal. There is a stable small pericardial effusion versus pericardial thickening. Mediastinum/Nodes: There are no enlarged mediastinal, hilar or axillary lymph nodes.Small mediastinal lymph nodes are stable. Hilar assessment is limited by the lack of intravenous contrast, although the hilar contours appear unchanged. The thyroid gland, trachea and esophagus demonstrate no significant findings. Lungs/Pleura: There is no pleural effusion or pneumothorax. There is moderate centrilobular and paraseptal emphysema with scattered subpleural scarring in both lungs. Linear scarring anteriorly in the right upper lobe, along the minor fissure is unchanged (most obvious on the reformatted images). In the right lower lobe, there is a slightly spiculated nodule measuring 14 x 7 mm (image 99/5). This has enlarged from the previous  study. No other enlarging nodules are identified. Upper abdomen: No acute findings. Probable nonobstructing calculus in the upper pole of the right kidney, stable. Extensive aortic and branch vessel atherosclerosis. Musculoskeletal/Chest wall: There is no chest wall mass or suspicious osseous finding. 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 There are chronic compression deformities at T7 and T9 status post T9 spinal augmentation. Advanced glenohumeral arthropathy on the right. IMPRESSION: 1. Enlarging spiculated right lower lobe pulmonary nodule is indeterminate, and potentially postinflammatory in etiology given its shape. Primary lung cancer is a consideration. Consider one of the following in 3 months: repeat chest CT or follow-up PET-CT. This recommendation is based on the consensus statement: Guidelines for Management of Incidental Pulmonary Nodules Detected on CT Images: From the Fleischner Society 2017; Radiology 2017; 284:228-243. 2. Otherwise stable examination without evidence of local recurrence or metastatic disease. No adenopathy. 3.  Emphysema with scattered pulmonary parenchymal scarring bilaterally. 4.  Aortic Atherosclerosis (ICD10-I70.0). Electronically Signed   By: Richardean Sale M.D.   On: 03/21/2018 17:27    Impression:  No evidence of recurrence on clinical exam today. Chest CT scan today however notes a an enlarging spiculated lesion in the right lower lobe is indeterminate. this measures 1.4 centimeters by 7 mm. No signs of recurrence in the right upper lobe.  Plan:  The patient will be scheduled for a repeat CT scan of the chest and follow-up in 3 months given this new finding on her most recent chest CT scan. ____________________________________   Blair Promise, PhD, MD  This document serves as a record of services personally performed by Gery Pray, MD. It was created on his behalf by Mary-Margaret Loma Messing, a trained medical scribe. The creation of this  record is based on the scribe's personal observations and the provider's statements to them. This document has been checked and approved by the attending provider.

## 2018-05-20 ENCOUNTER — Telehealth: Payer: Self-pay | Admitting: *Deleted

## 2018-05-20 NOTE — Telephone Encounter (Signed)
RETURNED PATIENT'S PHONE CALL, SPOKE WITH PATIENT. ?

## 2018-06-20 ENCOUNTER — Telehealth: Payer: Self-pay | Admitting: *Deleted

## 2018-06-20 NOTE — Telephone Encounter (Signed)
Patient called to reschedule her follow up appointment from 06/23/18 to after 07/17/18. Patient is scheduled for 07/18/18 @ 4:45 p.m.

## 2018-06-23 ENCOUNTER — Ambulatory Visit: Payer: Medicare PPO | Admitting: Radiation Oncology

## 2018-07-14 ENCOUNTER — Telehealth: Payer: Self-pay | Admitting: *Deleted

## 2018-07-14 NOTE — Telephone Encounter (Signed)
Called patient to inform of CT for 08-02-18 - arrival time- 1:15 pm @ [facility], no restrictions to test, and pt. to get results on 08-02-18 @ 4 pm with Dr. Sondra Come for results, spoke with patient and she is aware of these appts.

## 2018-07-18 ENCOUNTER — Ambulatory Visit: Payer: Medicare PPO | Admitting: Radiation Oncology

## 2018-08-02 ENCOUNTER — Ambulatory Visit (HOSPITAL_COMMUNITY): Payer: Medicare PPO

## 2018-08-02 ENCOUNTER — Ambulatory Visit: Payer: Medicare PPO | Admitting: Radiation Oncology

## 2018-08-19 ENCOUNTER — Other Ambulatory Visit: Payer: Self-pay

## 2018-08-19 ENCOUNTER — Ambulatory Visit (HOSPITAL_COMMUNITY)
Admission: RE | Admit: 2018-08-19 | Discharge: 2018-08-19 | Disposition: A | Discharge status: Home / Self Care | Payer: Medicare PPO | Source: Ambulatory Visit | Attending: Radiation Oncology | Admitting: Radiation Oncology

## 2018-08-19 DIAGNOSIS — C3411 Malignant neoplasm of upper lobe, right bronchus or lung: Secondary | ICD-10-CM | POA: Diagnosis not present

## 2018-08-22 ENCOUNTER — Other Ambulatory Visit: Payer: Self-pay

## 2018-08-22 ENCOUNTER — Encounter: Payer: Self-pay | Admitting: Radiation Oncology

## 2018-08-22 ENCOUNTER — Ambulatory Visit
Admission: RE | Admit: 2018-08-22 | Discharge: 2018-08-22 | Disposition: A | Discharge status: Home / Self Care | Payer: Medicare PPO | Source: Ambulatory Visit | Attending: Radiation Oncology | Admitting: Radiation Oncology

## 2018-08-22 VITALS — BP 154/62 | HR 67 | Temp 98.8°F | Resp 18 | Ht 63.0 in

## 2018-08-22 DIAGNOSIS — Z79899 Other long term (current) drug therapy: Secondary | ICD-10-CM | POA: Insufficient documentation

## 2018-08-22 DIAGNOSIS — M5136 Other intervertebral disc degeneration, lumbar region: Secondary | ICD-10-CM | POA: Diagnosis not present

## 2018-08-22 DIAGNOSIS — R599 Enlarged lymph nodes, unspecified: Secondary | ICD-10-CM | POA: Insufficient documentation

## 2018-08-22 DIAGNOSIS — Z7982 Long term (current) use of aspirin: Secondary | ICD-10-CM | POA: Insufficient documentation

## 2018-08-22 DIAGNOSIS — J449 Chronic obstructive pulmonary disease, unspecified: Secondary | ICD-10-CM | POA: Diagnosis not present

## 2018-08-22 DIAGNOSIS — Z85118 Personal history of other malignant neoplasm of bronchus and lung: Secondary | ICD-10-CM | POA: Insufficient documentation

## 2018-08-22 DIAGNOSIS — R918 Other nonspecific abnormal finding of lung field: Secondary | ICD-10-CM | POA: Diagnosis not present

## 2018-08-22 DIAGNOSIS — C3411 Malignant neoplasm of upper lobe, right bronchus or lung: Secondary | ICD-10-CM

## 2018-08-22 NOTE — Progress Notes (Signed)
Pt presents today for f/u with Dr. Sondra Come. Pt is accompanied by daughter. Pt has had pneumonia several times since October 2019 and was hospitalized at least 4 times. Pt reports SOB at baseline, attributed to COPD. Pt denies cough, hemoptysis. Daughter reports that pt gets "coughing fits" but pt states she is eating and gets choked. 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   BP (!) 154/62 (BP Location: Left Arm, Patient Position: Sitting)   Pulse 67   Temp 98.8 F (37.1 C) (Oral)   Resp 18   Ht 5\' 3"  (1.6 m)   SpO2 92%   BMI 19.52 kg/m   Wt Readings from Last 3 Encounters:  03/21/18 110 lb 3.2 oz (50 kg)  06/21/17 99 lb 6.4 oz (45.1 kg)  12/14/16 95 lb 12.8 oz (43.5 kg)   Loma Sousa, RN BSN

## 2018-08-22 NOTE — Patient Instructions (Signed)
Coronavirus (COVID-19) Are you at risk?  Are you at risk for the Coronavirus (COVID-19)?  To be considered HIGH RISK for Coronavirus (COVID-19), you have to meet the following criteria:  . Traveled to [place], [place], [place], [place] or [place]; or in the [place] to [place], [place], [place], or [place]; and have fever, cough, and shortness of breath within the last 2 weeks of travel OR . Been in close contact with a person diagnosed with COVID-19 within the last 2 weeks and have fever, cough, and shortness of breath . IF YOU DO NOT MEET THESE CRITERIA, YOU ARE CONSIDERED LOW RISK FOR COVID-19.  What to do if you are HIGH RISK for COVID-19?  . If you are having a medical emergency, call 911. . Seek medical care right away. Before you go to a doctor's office, urgent care or emergency department, call ahead and tell them about your recent travel, contact with someone diagnosed with COVID-19, and your symptoms. You should receive instructions from your physician's office regarding next steps of care.  . When you arrive at healthcare provider, tell the healthcare staff immediately you have returned from visiting [place], [place], [place], [place] or [place]; or traveled in the [place] to [place], [place], [place], or [place]; in the last two weeks or you have been in close contact with a person diagnosed with COVID-19 in the last 2 weeks.   . Tell the health care staff about your symptoms: fever, cough and shortness of breath. . After you have been seen by a medical provider, you will be either: o Tested for (COVID-19) and discharged home on quarantine except to seek medical care if symptoms worsen, and asked to  - Stay home and avoid contact with others until you get your results (4-5 days)  - Avoid travel on public transportation if possible (such as bus, train, or airplane) or o Sent to the Emergency Department by EMS for evaluation, COVID-19 testing, and possible  admission depending on your condition and test results.  What to do if you are LOW RISK for COVID-19?  Reduce your risk of any infection by using the same precautions used for avoiding the common cold or flu:  . Wash your hands often with soap and warm water for at least 20 seconds.  If soap and water are not readily available, use an alcohol-based hand sanitizer with at least 60% alcohol.  . If coughing or sneezing, cover your mouth and nose by coughing or sneezing into the elbow areas of your shirt or coat, into a tissue or into your sleeve (not your hands). . Avoid shaking hands with others and consider head nods or verbal greetings only. . Avoid touching your eyes, nose, or mouth with unwashed hands.  . Avoid close contact with people who are sick. . Avoid places or events with large numbers of people in one location, like concerts or sporting events. . Carefully consider travel plans you have or are making. . If you are planning any travel outside or inside the [place], visit the CDC's Travelers' Health webpage for the latest health notices. . If you have some symptoms but not all symptoms, continue to monitor at home and seek medical attention if your symptoms worsen. . If you are having a medical emergency, call 911.   ADDITIONAL HEALTHCARE OPTIONS FOR PATIENTS  [facility] / e-Visit: [email or web address]         [facility]: [phone]  [facility]: [phone]                   [facility]: [phone]   

## 2018-08-22 NOTE — Progress Notes (Signed)
Radiation Oncology         (336) [phone] ________________________________  Name: Jenny Howe MRN: [id number]  Date: 08/22/2018  DOB: [birth year]  Follow-Up/Reevaluation Visit Note  CC: Vickie Epley, MD  [name], MD    ICD-10-CM   1. Malignant neoplasm of upper lobe of right lung [facility]) C34.11     Diagnosis:   Malignant neoplasm of upper lobe of right lung ([place]) Squamous cell carcinoma, now with metachronous solitary pulmonary nodule in the right lower lobe  Radiation treatment dates:   September 3, September 8, February 02, 2013 Site/dose:   Right upper lobe nodule, 54 gray in 3 fractions (SBRT) Interval Since Last Radiation:  5 years, 6 months  Narrative:  The patient returns today for close follow-up. Her last chest CT scan back in late October of 2019 showed indeterminate enlarging spiculated nodule in the right lower lobe, therefore a repeat chest CT scan was recommended in approximately 3 months. Late last week she underwent her repeat chest CT which shows significant interval growth, now measuring 2.5 cm and more solid, of this spiculated growth. This is highly suspicious for metachronous primary bronchogenic carcinoma (vs. Metastasis). No evidence of recurrence in the right upper lobe, sight of her previous tumor. No suspicious adenopathy.  She remains on supplemental oxygen (2.5 L). Pt is accompanied by daughter. Pt has had pneumonia 4 times since October 2019, 3 of which required hospitalization. Pt reports SOB at baseline, attributed to COPD. Pt denies cough, hemoptysis. Daughter reports that pt gets "coughing fits" but pt states she is eating and gets choked. 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    She underwent a follow-up CT of her chest August 19, 2018 which showed significant interval growth of spiculated solid 2.5 cm right lower lobe pulmonary nodule, highly suspicious for metachronous primary bronchogenic carcinoma.  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 Mild right paratracheal adenopathy is stable. No new or progressive thoracic adenopathy.                ALLERGIES:  is allergic to penicillins; pneumococcal vaccine; procaine; codeine; ivp dye [iodinated diagnostic agents]; and povidone-iodine [povidone iodine].  Meds: Current Outpatient Medications  Medication Sig Dispense Refill   albuterol (PROVENTIL HFA;VENTOLIN HFA) 108 (90 BASE) MCG/ACT inhaler Inhale 2 puffs into the lungs daily as needed for wheezing. Refill called in to [place], [place] per Dr. Sondra Come. 6.7 g 3   albuterol (PROVENTIL) (2.5 MG/3ML) 0.083% nebulizer solution Take 2.5 mg by nebulization every 6 (six) hours as needed for wheezing.     arformoterol (BROVANA) 15 MCG/2ML NEBU      aspirin EC 81 MG tablet      atorvastatin (LIPITOR) 40 MG tablet Take 40 mg by mouth daily.     azelastine (ASTELIN) 137 MCG/SPRAY nasal spray Place 2 sprays into the nose daily. Use in each nostril as directed     budesonide (PULMICORT) 0.5 MG/2ML nebulizer solution Take 0.5 mg by nebulization 2 (two) times daily.     celecoxib (CELEBREX) 200 MG capsule Take 200 mg by mouth. Reported on 08/22/2015     cetirizine (ZYRTEC) 10 MG tablet Take 10 mg by mouth daily.     Cholecalciferol (VITAMIN D) 2000 UNITS CAPS Take by mouth daily.     diclofenac (FLECTOR) 1.3 % PTCH Place 1 patch onto the skin 2 (two) times daily. Reported  on 08/22/2015     diclofenac sodium (VOLTAREN) 1 % GEL Reported on 08/22/2015     doxycycline (VIBRAMYCIN) 100 MG capsule TK 1 C PO BID  0   gabapentin (NEURONTIN) 600 MG tablet Take 600 mg by mouth 2 (two) times daily.     Ipratropium-Albuterol (COMBIVENT) 20-100 MCG/ACT AERS respimat Inhale 1 puff into the lungs every 6 (six) hours. Reported on 08/22/2015     Krill Oil 500 MG CAPS Take by mouth daily.     levothyroxine (SYNTHROID,  LEVOTHROID) 112 MCG tablet Take 112 mcg by mouth daily before breakfast.     meclizine (ANTIVERT) 25 MG tablet Take 25 mg by mouth 3 (three) times daily.     morphine (MS CONTIN) 30 MG 12 hr tablet   0   Multiple Vitamin (MULTIVITAMIN) tablet Take 1 tablet by mouth daily.     oxyCODONE-acetaminophen (PERCOCET) 10-325 MG per tablet Take 1 tablet by mouth every 4 (four) hours as needed for pain.     predniSONE (DELTASONE) 10 MG tablet   0   PREDNISONE PO Take 10 mg by mouth daily. Reported on 08/22/2015     Probiotic Product (SOLUBLE FIBER/PROBIOTICS PO) Take 1 tablet by mouth daily. Reported on 08/22/2015     SPIRIVA RESPIMAT 2.5 MCG/ACT AERS   0   TRELEGY ELLIPTA 100-62.5-25 MCG/INH AEPB INL 1 PUFF PO AT THE SAME TIME QD  11   vitamin C (ASCORBIC ACID) 500 MG tablet Take 500 mg by mouth daily.     vitamin E 1000 UNIT capsule Take 1,000 Units by mouth daily.     No current facility-administered medications for this encounter.     Physical Findings: The patient is in no acute distress. Patient is alert and oriented.  height is 5\' 3"  (1.6 m). Her oral temperature is 98.8 F (37.1 C). Her blood pressure is 154/62 (abnormal) and her pulse is 67. Her respiration is 18 and oxygen saturation is 92%. . The lungs are clear , no wheezing noted today. Breath sounds in general are distant. The heart has a regular rhythm and rate. No palpable supraclavicular or axillary adenopathy. Supplemental oxygen in place, 2.5 L  Lab Findings: No results found for: WBC, HGB, HCT, MCV, PLT  Radiographic Findings: Ct Chest Wo Contrast  Result Date: 08/19/2018 CLINICAL DATA:  Right upper lobe squamous cell lung cancer diagnosed 2014 status post SBRT. Restaging. EXAM: CT CHEST WITHOUT CONTRAST TECHNIQUE: Multidetector CT imaging of the chest was performed following the standard protocol without IV contrast. COMPARISON:  03/21/2018 chest CT. FINDINGS: Cardiovascular: Normal heart size. Trace pericardial  effusion/thickening, unchanged. Left main and 3 vessel coronary atherosclerosis. Atherosclerotic nonaneurysmal thoracic aorta. Stable dilated main pulmonary artery (3.5 cm diameter). Mediastinum/Nodes: No discrete thyroid nodules. Unremarkable esophagus. No axillary adenopathy. Stable mild right paratracheal adenopathy up to 1.2 cm (series 2/image 56). No new pathologically enlarged mediastinal nodes. No discrete hilar adenopathy on this noncontrast scan Lungs/Pleura: No pneumothorax. No pleural effusion. Moderate centrilobular emphysema with mild diffuse bronchial wall thickening. Spiculated solid 2.5 x 2.3 cm right lower lobe pulmonary nodule (series 5/image 96), increased from 1.4 x 0.7 cm. Sharply marginated bandlike consolidation in basilar right upper lobe with associated mild volume loss and distortion and internal calcifications, unchanged, compatible with radiation fibrosis. Patchy subpleural reticulation with associated mild traction bronchiectasis in both lungs, not appreciably changed. No additional significant pulmonary nodules. Upper abdomen: Nonspecific calcifications in the central right kidney, largest 3 mm, either nonobstructing stones or vascular calcifications. Musculoskeletal:  No aggressive appearing focal osseous lesions. Stable posttreatment change in anterior right fourth rib. Stable vertebroplasty change within moderate T7 vertebral compression fracture. Stable severe T9 vertebral compression fracture. Moderate thoracic spondylosis. Partially visualized bilateral posterior spinal fusion hardware in the lower lumbar spine on the scout topogram. IMPRESSION: 1. Significant interval growth of spiculated solid 2.5 cm right lower lobe pulmonary nodule, highly suspicious for metachronous primary bronchogenic carcinoma. 2. 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 3. Mild right paratracheal adenopathy is stable. No new or  progressive thoracic adenopathy. Aortic Atherosclerosis (ICD10-I70.0) and Emphysema (ICD10-J43.9). Electronically Signed   By: Ilona Sorrel M.D.   On: 08/19/2018 16:41    Impression:  Metachronous primary bronchogenic carcinoma. I reviewed the patient's images with her and her daughter. We discussed management options, including potential PET scan for further evaluation. Pt would not be a candidate for surgery, and biopsy of this lesion would be too risky given her present breathing state. The location and size of the lesion would allow for SBRT in a similar matter to her previous treatment. After careful consideration, the pt would like to proceed with treatment.  She is not interested in proceeding with a PET scan for further evaluation I offered that she could receive treatment in [place] closer to her home, but she would like to receive treatment here in [place].   Plan:  Pt will be scheduled for CT simulation, with treatments to begin approximately a week later. We also discussed the implications of the GHWEX-93 pandemic and how that may factor into her treatment. Pt does not wish to delay her treatments. Will proceed with planning next week.   ____________________________________   Blair Promise, PhD, MD  This document serves as a record of services personally performed by Gery Pray, MD. It was created on his behalf by Mary-Margaret Loma Messing, a trained medical scribe. The creation of this record is based on the scribe's personal observations and the provider's statements to them. This document has been checked and approved by the attending provider.

## 2018-09-01 ENCOUNTER — Telehealth: Payer: Self-pay | Admitting: *Deleted

## 2018-09-01 NOTE — Telephone Encounter (Signed)
On 09-01-18 fax medical records to [facility]

## 2018-10-31 ENCOUNTER — Ambulatory Visit
Admission: RE | Admit: 2018-10-31 | Discharge: 2018-10-31 | Disposition: A | Discharge status: Home / Self Care | Payer: Medicare PPO | Source: Ambulatory Visit | Attending: Radiation Oncology | Admitting: Radiation Oncology

## 2018-10-31 ENCOUNTER — Other Ambulatory Visit: Payer: Self-pay

## 2018-10-31 DIAGNOSIS — R911 Solitary pulmonary nodule: Secondary | ICD-10-CM

## 2018-10-31 DIAGNOSIS — Z510 Encounter for antineoplastic radiation therapy: Secondary | ICD-10-CM | POA: Insufficient documentation

## 2018-10-31 DIAGNOSIS — C3431 Malignant neoplasm of lower lobe, right bronchus or lung: Secondary | ICD-10-CM | POA: Diagnosis not present

## 2018-10-31 MED ORDER — OXYCODONE-ACETAMINOPHEN 10-325 MG PO TABS: 1.0000 | ORAL_TABLET | ORAL | 0 refills | Status: AC | PRN

## 2018-10-31 NOTE — Progress Notes (Signed)
  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  Identity was confirmed.  All relevant records and images related to the planned course of therapy were reviewed.  The patient freely provided informed written consent to proceed with treatment after reviewing the details related to the planned course of therapy. The consent form was witnessed and verified by the simulation staff.  Then, the patient was set-up in a stable reproducible  supine position for radiation therapy.  A BodyFix immobilization pillow was fabricated for reproducible positioning.  Then I personally applied the abdominal compression paddle to limit respiratory excursion.  4D respiratoy motion management CT images were obtained.  Surface markings were placed.  The CT images were loaded into the planning software.  Then, using Cine, MIP, and standard views, the internal target volume (ITV) and planning target volumes (PTV) were delinieated, and avoidance structures were contoured.  Treatment planning then occurred.  The radiation prescription was entered and confirmed.  A total of two complex treatment devices were fabricated in the form of the BodyFix immobilization pillow and a neck accuform cushion.  I have requested : 3D Simulation  I have requested a DVH of the following structures: Heart, Lungs, Esophagus, Chest Wall, Brachial Plexus, Major Blood Vessels, and targets.  PLAN:  The patient will receive 54 Gy in 3 fractions.  -----------------------------------  Blair Promise, PhD, MD This document serves as a record of  services personally performed by Gery Pray, MD. It was created on his behalf by Mary-Margaret Loma Messing, a trained medical scribe. The creation of this record is based on the scribe's personal observations and the provider's statements to them. This document has been checked and approved by the attending provider.

## 2018-11-02 ENCOUNTER — Telehealth: Payer: Self-pay

## 2018-11-02 NOTE — Telephone Encounter (Signed)
Correction: daughter requesting Percocet changed to Morphine. Will convey message to Dr. Sondra Come. Loma Sousa, RN BSN

## 2018-11-02 NOTE — Telephone Encounter (Signed)
Attempted to return call to pt's daughter [name], who is requesting Morphine be changed to Percocets for PRN pain control. Unable to leave VM. Attempted home number and female answered, stated she was not [name] with no further information offered. Will attempt to contact daughter as able. Loma Sousa, RN BSN

## 2018-11-03 DIAGNOSIS — Z510 Encounter for antineoplastic radiation therapy: Secondary | ICD-10-CM | POA: Diagnosis not present

## 2018-11-08 ENCOUNTER — Ambulatory Visit
Admission: RE | Admit: 2018-11-08 | Discharge: 2018-11-08 | Disposition: A | Discharge status: Home / Self Care | Payer: Medicare PPO | Source: Ambulatory Visit | Attending: Radiation Oncology | Admitting: Radiation Oncology

## 2018-11-08 ENCOUNTER — Other Ambulatory Visit: Payer: Self-pay

## 2018-11-08 DIAGNOSIS — Z510 Encounter for antineoplastic radiation therapy: Secondary | ICD-10-CM | POA: Diagnosis not present

## 2018-11-08 DIAGNOSIS — R911 Solitary pulmonary nodule: Secondary | ICD-10-CM

## 2018-11-08 MED ORDER — MORPHINE SULFATE ER 30 MG PO TBCR: 30.0000 mg | EXTENDED_RELEASE_TABLET | Freq: Two times a day (BID) | ORAL | 0 refills | Status: AC

## 2018-11-08 MED ORDER — ALBUTEROL SULFATE HFA 108 (90 BASE) MCG/ACT IN AERS: 2.0000 | INHALATION_SPRAY | Freq: Every day | RESPIRATORY_TRACT | 3 refills | Status: AC | PRN

## 2018-11-08 MED ORDER — PROMETHAZINE HCL 25 MG PO TABS: 25.0000 mg | ORAL_TABLET | Freq: Four times a day (QID) | ORAL | 0 refills | Status: DC | PRN

## 2018-11-08 NOTE — Progress Notes (Signed)
  Radiation Oncology         [phone] ________________________________  Name: Jenny Howe MRN: [id number]  Date: 11/08/2018  DOB: [birth year]  Stereotactic Body Radiotherapy Treatment Procedure Note  NARRATIVE:  Jenny Howe was brought to the stereotactic radiation treatment machine and placed supine on the CT couch. The patient was set up for stereotactic body radiotherapy on the body fix pillow.  3D TREATMENT PLANNING AND DOSIMETRY:  The patient's radiation plan was reviewed and approved prior to starting treatment.  It showed 3-dimensional radiation distributions overlaid onto the planning CT.  The [facility] for the target structures as well as the organs at risk were reviewed. The documentation of this is filed in the radiation oncology EMR.  SIMULATION VERIFICATION:  The patient underwent CT imaging on the treatment unit.  These were carefully aligned to document that the ablative radiation dose would cover the target volume and maximally spare the nearby organs at risk according to the planned distribution.  SPECIAL TREATMENT PROCEDURE: Jenny Howe received high dose ablative stereotactic body radiotherapy to the planned target volume without unforeseen complications. Treatment was delivered uneventfully. The high doses associated with stereotactic body radiotherapy and the significant potential risks require careful treatment set up and patient monitoring constituting a special treatment procedure   STEREOTACTIC TREATMENT MANAGEMENT:  Following delivery, the patient was evaluated clinically. The patient tolerated treatment without significant acute effects, and was discharged to home in stable condition.    PLAN: Continue treatment as planned.  ________________________________  Blair Promise, PhD, MD

## 2018-11-10 ENCOUNTER — Other Ambulatory Visit: Payer: Self-pay

## 2018-11-10 ENCOUNTER — Ambulatory Visit
Admission: RE | Admit: 2018-11-10 | Discharge: 2018-11-10 | Disposition: A | Discharge status: Home / Self Care | Payer: Medicare PPO | Source: Ambulatory Visit | Attending: Radiation Oncology | Admitting: Radiation Oncology

## 2018-11-10 DIAGNOSIS — C3411 Malignant neoplasm of upper lobe, right bronchus or lung: Secondary | ICD-10-CM

## 2018-11-10 DIAGNOSIS — Z510 Encounter for antineoplastic radiation therapy: Secondary | ICD-10-CM | POA: Diagnosis not present

## 2018-11-10 NOTE — Progress Notes (Signed)
  Radiation Oncology         [phone] ________________________________  Name: Jenny Howe MRN: [id number]  Date: 11/10/2018  DOB: 1946-02-26  Stereotactic Body Radiotherapy Treatment Procedure Note  NARRATIVE:  Jenny Howe was brought to the stereotactic radiation treatment machine and placed supine on the CT couch. The patient was set up for stereotactic body radiotherapy on the body fix pillow.  3D TREATMENT PLANNING AND DOSIMETRY:  The patient's radiation plan was reviewed and approved prior to starting treatment.  It showed 3-dimensional radiation distributions overlaid onto the planning CT.  The [facility] for the target structures as well as the organs at risk were reviewed. The documentation of this is filed in the radiation oncology EMR.  SIMULATION VERIFICATION:  The patient underwent CT imaging on the treatment unit.  These were carefully aligned to document that the ablative radiation dose would cover the target volume and maximally spare the nearby organs at risk according to the planned distribution.  SPECIAL TREATMENT PROCEDURE: Jenny Howe received high dose ablative stereotactic body radiotherapy to the planned target volume without unforeseen complications. Treatment was delivered uneventfully. The high doses associated with stereotactic body radiotherapy and the significant potential risks require careful treatment set up and patient monitoring constituting a special treatment procedure   STEREOTACTIC TREATMENT MANAGEMENT:  Following delivery, the patient was evaluated clinically. The patient tolerated treatment without significant acute effects, and was discharged to home in stable condition.    PLAN: Continue treatment as planned.  ________________________________  Blair Promise, PhD, MD   This document serves as a record of services personally performed by Gery Pray, MD. It was created on his behalf by Mary-Margaret Loma Messing, a trained medical scribe. The creation  of this record is based on the scribe's personal observations and the provider's statements to them. This document has been checked and approved by the attending provider.

## 2018-11-14 NOTE — Progress Notes (Signed)
  Radiation Oncology         (336) [phone] ________________________________  Name: Jenny Howe MRN: [id number]  Date: 11/15/2018  DOB: May 07, 1946  Stereotactic Body Radiotherapy Treatment Procedure Note  NARRATIVE:  Jenny Howe was brought to the stereotactic radiation treatment machine and placed supine on the CT couch. The patient was set up for stereotactic body radiotherapy on the body fix pillow.  3D TREATMENT PLANNING AND DOSIMETRY:  The patient's radiation plan was reviewed and approved prior to starting treatment.  It showed 3-dimensional radiation distributions overlaid onto the planning CT.  The [facility] for the target structures as well as the organs at risk were reviewed. The documentation of this is filed in the radiation oncology EMR.  SIMULATION VERIFICATION:  The patient underwent CT imaging on the treatment unit.  These were carefully aligned to document that the ablative radiation dose would cover the target volume and maximally spare the nearby organs at risk according to the planned distribution.  SPECIAL TREATMENT PROCEDURE: Jenny Howe received high dose ablative stereotactic body radiotherapy to the planned target volume without unforeseen complications. Treatment was delivered uneventfully. The high doses associated with stereotactic body radiotherapy and the significant potential risks require careful treatment set up and patient monitoring constituting a special treatment procedure   STEREOTACTIC TREATMENT MANAGEMENT:  Following delivery, the patient was evaluated clinically. The patient tolerated treatment without significant acute effects, and was discharged to home in stable condition.    PLAN: Continue treatment as planned.  ________________________________  Blair Promise, PhD, MD   This document serves as a record of services personally performed by Gery Pray, MD. It was created on his behalf by Mary-Margaret Loma Messing, a trained medical scribe. The creation  of this record is based on the scribe's personal observations and the provider's statements to them. This document has been checked and approved by the attending provider.

## 2018-11-15 ENCOUNTER — Ambulatory Visit
Admission: RE | Admit: 2018-11-15 | Discharge: 2018-11-15 | Disposition: A | Discharge status: Home / Self Care | Payer: Medicare PPO | Source: Ambulatory Visit | Attending: Radiation Oncology | Admitting: Radiation Oncology

## 2018-11-15 ENCOUNTER — Other Ambulatory Visit: Payer: Self-pay

## 2018-11-15 ENCOUNTER — Encounter: Payer: Self-pay | Admitting: Radiation Oncology

## 2018-11-15 ENCOUNTER — Other Ambulatory Visit: Payer: Self-pay | Admitting: Radiation Oncology

## 2018-11-15 DIAGNOSIS — Z510 Encounter for antineoplastic radiation therapy: Secondary | ICD-10-CM | POA: Diagnosis not present

## 2018-11-15 DIAGNOSIS — R911 Solitary pulmonary nodule: Secondary | ICD-10-CM

## 2018-11-16 NOTE — Progress Notes (Signed)
  Patient Name: Jenny Howe MRN: [id number] DOB: 1945/06/18 Referring Physician: Lavera Guise (Profile Not Attached) Date of Service: 11/15/2018 [facility], [place]                                                        End Of Treatment Note  Diagnoses: 162.3-Malignant neoplasm of upper lobe, bronchus or lung 162.9-Malignant neoplasm of bronchus and lung unspecified C34.11-Malignant neoplasm of upper lobe, right bronchus or lung R91.8-Other nonspecific abnormal finding of lung field    Intent: Curative  Radiation Treatment Dates: 11/08/2018, 11/10/2018, 11/15/2018 Site Technique Total Dose Dose per Fx Completed Fx Beam Energies  Thorax: Lung_Rt SBRT 54/54 18 3/3 6XFFF   Narrative: The patient tolerated radiation therapy relatively well. By the end of her treatment, she denied difficulty swallowing, vomiting and hemoptysis.  Plan: The patient will follow-up with radiation oncology in 1 month .  ________________________________________________ Jeneen Rinks d. Sondra Come, MD

## 2018-12-12 ENCOUNTER — Ambulatory Visit: Payer: Medicare PPO | Admitting: Radiation Oncology

## 2018-12-15 ENCOUNTER — Other Ambulatory Visit: Payer: Self-pay | Admitting: Radiation Oncology

## 2018-12-15 MED ORDER — PROMETHAZINE HCL 25 MG PO TABS: 25.0000 mg | ORAL_TABLET | Freq: Four times a day (QID) | ORAL | 2 refills | Status: DC | PRN

## 2018-12-19 ENCOUNTER — Ambulatory Visit: Payer: Self-pay | Admitting: Radiation Oncology

## 2019-01-19 ENCOUNTER — Ambulatory Visit: Payer: Medicare PPO | Admitting: Radiation Oncology

## 2019-03-02 ENCOUNTER — Telehealth: Payer: Self-pay | Admitting: *Deleted

## 2019-03-02 NOTE — Telephone Encounter (Signed)
CALLED PATIENT TO INFORM OF CT FOR 03-06-19 - ARRIVAL TIME-1:15 PM @ [facility], NO RESTRICTIONS TO TEST, AND PATIENT TO FOLLOW-UP WITH DR. KINARD ON 03-09-19 FOR RESULTS, LVM FOR  A RETURN CALL

## 2019-03-03 ENCOUNTER — Telehealth: Payer: Self-pay | Admitting: *Deleted

## 2019-03-03 NOTE — Telephone Encounter (Signed)
CALLED PATIENT TO INFORM OF CT FOR 03-06-19 - ARRIVAL TIME- 1:15 PM @ [facility], NO RESTRICTIONS TO TEST, AND PATIENT TO FOLLOW-UP WITH DR. KINARD FOR RESULTS ON 03-09-19 @ 11:45, SPOKE WITH PATIENT AND SHE IS AWARE OF THESE APPT. DATES AND TIMES

## 2019-03-03 NOTE — Telephone Encounter (Signed)
Called patient's daughter to inform of CT for 03-06-19, voice mail full, unable to leave message

## 2019-03-06 ENCOUNTER — Ambulatory Visit (HOSPITAL_COMMUNITY): Admission: RE | Admit: 2019-03-06 | Payer: Medicare PPO | Source: Ambulatory Visit

## 2019-03-06 ENCOUNTER — Telehealth: Payer: Self-pay | Admitting: *Deleted

## 2019-03-06 NOTE — Telephone Encounter (Signed)
Called patient's relative- [name] Ray to inform that Ct has been moved to 03-24-19 - arrival time- 12:45 pm @ [facility],no restrictions to test, and patient to receive results from Dr. Sondra Come on 03-27-19 @ 10:45 am, spoke with [name] Ray and she is aware of the changes to the test and fu.

## 2019-03-08 ENCOUNTER — Telehealth: Payer: Self-pay | Admitting: *Deleted

## 2019-03-08 NOTE — Telephone Encounter (Signed)
On 03/08/19 faxed medical records to [facility], it was notes from 02/2018- to the present

## 2019-03-09 ENCOUNTER — Ambulatory Visit: Payer: Medicare PPO | Admitting: Radiation Oncology

## 2019-03-24 ENCOUNTER — Ambulatory Visit (HOSPITAL_COMMUNITY)
Admission: RE | Admit: 2019-03-24 | Discharge: 2019-03-24 | Disposition: A | Discharge status: Home / Self Care | Payer: Medicare PPO | Source: Ambulatory Visit | Attending: Radiation Oncology | Admitting: Radiation Oncology

## 2019-03-24 ENCOUNTER — Other Ambulatory Visit: Payer: Self-pay

## 2019-03-24 ENCOUNTER — Encounter (HOSPITAL_COMMUNITY): Payer: Self-pay

## 2019-03-24 DIAGNOSIS — R911 Solitary pulmonary nodule: Secondary | ICD-10-CM | POA: Insufficient documentation

## 2019-03-27 ENCOUNTER — Other Ambulatory Visit: Payer: Self-pay

## 2019-03-27 ENCOUNTER — Encounter: Payer: Self-pay | Admitting: Radiation Oncology

## 2019-03-27 ENCOUNTER — Ambulatory Visit
Admission: RE | Admit: 2019-03-27 | Discharge: 2019-03-27 | Disposition: A | Discharge status: Home / Self Care | Payer: Medicare PPO | Source: Ambulatory Visit | Attending: Radiation Oncology | Admitting: Radiation Oncology

## 2019-03-27 VITALS — BP 120/46 | HR 88 | Temp 98.2°F | Resp 24 | Wt 114.6 lb

## 2019-03-27 DIAGNOSIS — R590 Localized enlarged lymph nodes: Secondary | ICD-10-CM | POA: Insufficient documentation

## 2019-03-27 DIAGNOSIS — Z79899 Other long term (current) drug therapy: Secondary | ICD-10-CM | POA: Diagnosis not present

## 2019-03-27 DIAGNOSIS — R911 Solitary pulmonary nodule: Secondary | ICD-10-CM | POA: Diagnosis not present

## 2019-03-27 DIAGNOSIS — J439 Emphysema, unspecified: Secondary | ICD-10-CM | POA: Diagnosis not present

## 2019-03-27 DIAGNOSIS — Z7982 Long term (current) use of aspirin: Secondary | ICD-10-CM | POA: Insufficient documentation

## 2019-03-27 NOTE — Patient Instructions (Signed)
Coronavirus (COVID-19) Are you at risk?  Are you at risk for the Coronavirus (COVID-19)?  To be considered HIGH RISK for Coronavirus (COVID-19), you have to meet the following criteria:  . Traveled to [place], [place], [place], [place] or [place]; or in the [place] to [place], [place], [place], or [place]; and have fever, cough, and shortness of breath within the last 2 weeks of travel OR . Been in close contact with a person diagnosed with COVID-19 within the last 2 weeks and have fever, cough, and shortness of breath . IF YOU DO NOT MEET THESE CRITERIA, YOU ARE CONSIDERED LOW RISK FOR COVID-19.  What to do if you are HIGH RISK for COVID-19?  . If you are having a medical emergency, call 911. . Seek medical care right away. Before you go to a doctor's office, urgent care or emergency department, call ahead and tell them about your recent travel, contact with someone diagnosed with COVID-19, and your symptoms. You should receive instructions from your physician's office regarding next steps of care.  . When you arrive at healthcare provider, tell the healthcare staff immediately you have returned from visiting [place], [place], [place], [place] or [place]; or traveled in the [place] to [place], [place], [place], or [place]; in the last two weeks or you have been in close contact with a person diagnosed with COVID-19 in the last 2 weeks.   . Tell the health care staff about your symptoms: fever, cough and shortness of breath. . After you have been seen by a medical provider, you will be either: o Tested for (COVID-19) and discharged home on quarantine except to seek medical care if symptoms worsen, and asked to  - Stay home and avoid contact with others until you get your results (4-5 days)  - Avoid travel on public transportation if possible (such as bus, train, or airplane) or o Sent to the Emergency Department by EMS for evaluation, COVID-19 testing, and possible  admission depending on your condition and test results.  What to do if you are LOW RISK for COVID-19?  Reduce your risk of any infection by using the same precautions used for avoiding the common cold or flu:  . Wash your hands often with soap and warm water for at least 20 seconds.  If soap and water are not readily available, use an alcohol-based hand sanitizer with at least 60% alcohol.  . If coughing or sneezing, cover your mouth and nose by coughing or sneezing into the elbow areas of your shirt or coat, into a tissue or into your sleeve (not your hands). . Avoid shaking hands with others and consider head nods or verbal greetings only. . Avoid touching your eyes, nose, or mouth with unwashed hands.  . Avoid close contact with people who are sick. . Avoid places or events with large numbers of people in one location, like concerts or sporting events. . Carefully consider travel plans you have or are making. . If you are planning any travel outside or inside the [place], visit the CDC's Travelers' Health webpage for the latest health notices. . If you have some symptoms but not all symptoms, continue to monitor at home and seek medical attention if your symptoms worsen. . If you are having a medical emergency, call 911.   ADDITIONAL HEALTHCARE OPTIONS FOR PATIENTS  [facility] / e-Visit: [email or web address]         [facility]: [phone]     Urgent Care: [phone]                   [facility]: [phone]   

## 2019-03-27 NOTE — Progress Notes (Signed)
Patient in for follow up. Here for CT results. Has  back pain but no other issues.On 2.5 L 02 via [place] BP (!) 120/46 (BP Location: Left Arm, Patient Position: Sitting)   Pulse 88   Temp 98.2 F (36.8 C)   Resp (!) 24   Wt 114 lb 9.6 oz (52 kg)   SpO2 95%   BMI 20.30 kg/m

## 2019-03-27 NOTE — Progress Notes (Signed)
Radiation Oncology         (336) [phone] ________________________________  Name: Jenny Howe MRN: [id number]  Date: 03/27/2019  DOB: [birth year]  Follow-Up Visit Note  CC: Vickie Epley, MD  [name], MD    ICD-10-CM   1. Nodule of right lung  R91.1     Diagnosis: Squamous cell carcinoma, now with metachronous solitary pulmonary nodule in the right lower lobe  Interval Since Last Radiation: Four months and one week.  11/08/2018, 11/10/2018, 11/15/2018 Site Technique Total Dose Dose per Fx Completed Fx Beam Energies  Thorax: Lung_Rt SBRT 54/54 18 3/3 6XFFF     Narrative:  The patient returns today for routine follow-up. Patient had a CT of chest on 03/24/2019 which showed: interval decrease in size of the spiculated right lower lobe pulmonary nodule; similar appearance of post treatment scarring; stable appearance of upper normal to borderline enlarged mediastinal lymph nodes.   Patient reports no change in her breathing.  She continues on supplemental oxygen.  She denies any pain in the chest area or hemoptysis.                            ALLERGIES:  is allergic to penicillins; pneumococcal vaccine; procaine; codeine; ivp dye [iodinated diagnostic agents]; and povidone-iodine [povidone iodine].  Meds: Current Outpatient Medications  Medication Sig Dispense Refill  . albuterol (PROVENTIL) (2.5 MG/3ML) 0.083% nebulizer solution Take 2.5 mg by nebulization every 6 (six) hours as needed for wheezing.    Marland Kitchen albuterol (VENTOLIN HFA) 108 (90 Base) MCG/ACT inhaler Inhale 2 puffs into the lungs daily as needed for wheezing. 6.7 g 3  . arformoterol (BROVANA) 15 MCG/2ML NEBU     . aspirin EC 81 MG tablet     . atorvastatin (LIPITOR) 40 MG tablet Take 40 mg by mouth daily.    Marland Kitchen azelastine (ASTELIN) 137 MCG/SPRAY nasal spray Place 2 sprays into the nose daily. Use in each nostril as directed    . budesonide (PULMICORT) 0.5 MG/2ML nebulizer solution Take 0.5 mg by nebulization 2  (two) times daily.    . celecoxib (CELEBREX) 200 MG capsule Take 200 mg by mouth. Reported on 08/22/2015    . cetirizine (ZYRTEC) 10 MG tablet Take 10 mg by mouth daily.    . Cholecalciferol (VITAMIN D) 2000 UNITS CAPS Take by mouth daily.    Marland Kitchen gabapentin (NEURONTIN) 600 MG tablet Take 600 mg by mouth 2 (two) times daily.    Javier Docker Oil 500 MG CAPS Take by mouth daily.    Marland Kitchen levothyroxine (SYNTHROID, LEVOTHROID) 112 MCG tablet Take 112 mcg by mouth daily before breakfast.    . meclizine (ANTIVERT) 25 MG tablet Take 25 mg by mouth 3 (three) times daily.    Marland Kitchen morphine (MS CONTIN) 30 MG 12 hr tablet Take 1 tablet (30 mg total) by mouth every 12 (twelve) hours. 30 tablet 0  . Multiple Vitamin (MULTIVITAMIN) tablet Take 1 tablet by mouth daily.    . ondansetron (ZOFRAN-ODT) 8 MG disintegrating tablet DIS ONE T PO Q 8 H PRN UTD    . oxyCODONE-acetaminophen (PERCOCET) 10-325 MG tablet Take 1 tablet by mouth every 4 (four) hours as needed for pain. 30 tablet 0  . TRELEGY ELLIPTA 100-62.5-25 MCG/INH AEPB INL 1 PUFF PO AT THE SAME TIME QD  11  . vitamin C (ASCORBIC ACID) 500 MG tablet Take 500 mg by mouth daily.    . vitamin E 1000 UNIT capsule  Take 1,000 Units by mouth daily.    Marland Kitchen SPIRIVA RESPIMAT 2.5 MCG/ACT AERS   0   No current facility-administered medications for this encounter.     Physical Findings: The patient is in no acute distress. Patient is alert and oriented.  weight is 114 lb 9.6 oz (52 kg). Her temperature is 98.2 F (36.8 C). Her blood pressure is 120/46 (abnormal) and her pulse is 88. Her respiration is 24 (abnormal) and oxygen saturation is 95%. .  No significant changes.  Lab Findings: No results found for: WBC, HGB, HCT, MCV, PLT  Radiographic Findings: Ct Chest Wo Contrast  Result Date: 03/24/2019 CLINICAL DATA:  Non-small-cell lung cancer. EXAM: CT CHEST WITHOUT CONTRAST TECHNIQUE: Multidetector CT imaging of the chest was performed following the standard protocol without  IV contrast. COMPARISON:  08/19/2018 FINDINGS: Cardiovascular: The heart size is normal. No substantial pericardial effusion. Coronary artery calcification is evident. Atherosclerotic calcification is noted in the wall of the thoracic aorta. Enlargement of the main pulmonary artery suggests pulmonary arterial hypertension. Mediastinum/Nodes: Scattered upper normal and mildly enlarged mediastinal lymph nodes are similar to prior. 11 mm short axis precarinal node measured previously is stable. No evidence for gross hilar lymphadenopathy although assessment is limited by the lack of intravenous contrast on today's study. The esophagus has normal imaging features. There is no axillary lymphadenopathy. Lungs/Pleura: Centrilobular and paraseptal emphysema evident. Stable appearance right parahilar scarring. Spiculated right lower lobe pulmonary nodule measures 2.2 x 1.7 cm today which compares to 2.5 x 2.3 cm previously. Similar appearance of probable scarring posterior lower lobes bilaterally. Upper Abdomen: Abdominal aortic atherosclerosis. Musculoskeletal: Bones are diffusely demineralized. Stable midthoracic compression fractures. IMPRESSION: 1. Interval decrease in size of the spiculated right lower lobe pulmonary nodule. 2. Otherwise similar appearance of post treatment scarring in the parahilar right lung with advanced changes of emphysema. 3. Stable appearance of upper normal to borderline enlarged mediastinal lymph nodes. No progressive lymphadenopathy in the thorax. 4.  Emphysema. (ICD10-J43.9) 5.  Aortic Atherosclerois (ICD10-170.0) Electronically Signed   By: Misty Stanley M.D.   On: 03/24/2019 15:21    Impression: Favorable response to recent course of radiation therapy.  No new problems on recent chest CT scan  Plan: CT scan of the chest in 3 months followed by follow-up soon afterward.  If the scan is stable then she will proceed with every 6 months scans.  ____________________________________ Gery Pray, MD  This document serves as a record of services personally performed by Gery Pray, MD. It was created on his behalf by Clerance Lav, a trained medical scribe. The creation of this record is based on the scribe's personal observations and the provider's statements to them. This document has been checked and approved by the attending provider.

## 2019-06-16 ENCOUNTER — Telehealth: Payer: Self-pay | Admitting: *Deleted

## 2019-06-16 NOTE — Telephone Encounter (Signed)
CALLED PATIENT TO INFORM OF CT FOR 06-27-19 - ARRIVAL TIME- 1:45 PM @ [facility], NO RESTRICTIONS TO TEST, PATIENT TO GET RESULTS FROM DR. KINARD ON 06-29-19 @ 10:45 AM, SPOKE WITH PATIENT AND SHE IS AWARE OF THESE APPTS.

## 2019-06-23 ENCOUNTER — Telehealth: Payer: Self-pay | Admitting: *Deleted

## 2019-06-23 NOTE — Telephone Encounter (Signed)
CALLED PATIENT TO INFORM THAT SCAN HAS BEEN MOVED TO 08-21-19 - ARRIVAL TIME- 1:45 PM @ [facility], NO RESTRICTIONS TO TEST AND HER FU APPT. WILL BE ON 08-24-19 @ 11:45 AM WITH DR. KINARD FOR RESULTS, APPTS. CHANGED PER PATIENT REQUEST, SPOKE WITH PATIENT AND SHE IS AWARE OF THESE APPTS. AND IS GOOD WITH THEM.

## 2019-06-27 ENCOUNTER — Ambulatory Visit (HOSPITAL_COMMUNITY): Payer: Medicare Other

## 2019-06-29 ENCOUNTER — Ambulatory Visit: Payer: Medicare Other | Admitting: Radiation Oncology

## 2019-08-04 ENCOUNTER — Other Ambulatory Visit: Payer: Self-pay

## 2019-08-04 MED ORDER — PROMETHAZINE HCL 25 MG PO TABS: 25.0000 mg | ORAL_TABLET | Freq: Four times a day (QID) | ORAL | 0 refills | Status: DC | PRN

## 2019-08-21 ENCOUNTER — Ambulatory Visit (HOSPITAL_COMMUNITY): Admission: RE | Admit: 2019-08-21 | Payer: Medicare Other | Source: Ambulatory Visit

## 2019-08-22 ENCOUNTER — Other Ambulatory Visit: Payer: Self-pay

## 2019-08-22 ENCOUNTER — Encounter (HOSPITAL_COMMUNITY): Payer: Self-pay

## 2019-08-22 ENCOUNTER — Ambulatory Visit (HOSPITAL_COMMUNITY)
Admission: RE | Admit: 2019-08-22 | Discharge: 2019-08-22 | Disposition: A | Discharge status: Home / Self Care | Payer: Medicare Other | Source: Ambulatory Visit | Attending: Radiation Oncology | Admitting: Radiation Oncology

## 2019-08-22 DIAGNOSIS — R911 Solitary pulmonary nodule: Secondary | ICD-10-CM | POA: Diagnosis not present

## 2019-08-24 ENCOUNTER — Ambulatory Visit: Payer: Medicare Other | Admitting: Radiation Oncology

## 2019-08-24 ENCOUNTER — Ambulatory Visit
Admission: RE | Admit: 2019-08-24 | Discharge: 2019-08-24 | Disposition: A | Discharge status: Home / Self Care | Payer: Medicare Other | Source: Ambulatory Visit | Attending: Radiation Oncology | Admitting: Radiation Oncology

## 2019-08-24 ENCOUNTER — Encounter: Payer: Self-pay | Admitting: Radiation Oncology

## 2019-08-24 ENCOUNTER — Other Ambulatory Visit: Payer: Self-pay

## 2019-08-24 VITALS — BP 138/62 | HR 68 | Temp 98.3°F | Resp 20 | Ht 60.0 in | Wt 111.4 lb

## 2019-08-24 DIAGNOSIS — Z7982 Long term (current) use of aspirin: Secondary | ICD-10-CM | POA: Diagnosis not present

## 2019-08-24 DIAGNOSIS — Z923 Personal history of irradiation: Secondary | ICD-10-CM | POA: Insufficient documentation

## 2019-08-24 DIAGNOSIS — Z791 Long term (current) use of non-steroidal anti-inflammatories (NSAID): Secondary | ICD-10-CM | POA: Insufficient documentation

## 2019-08-24 DIAGNOSIS — J449 Chronic obstructive pulmonary disease, unspecified: Secondary | ICD-10-CM

## 2019-08-24 DIAGNOSIS — J439 Emphysema, unspecified: Secondary | ICD-10-CM | POA: Insufficient documentation

## 2019-08-24 DIAGNOSIS — Z79899 Other long term (current) drug therapy: Secondary | ICD-10-CM | POA: Diagnosis not present

## 2019-08-24 DIAGNOSIS — R911 Solitary pulmonary nodule: Secondary | ICD-10-CM | POA: Diagnosis not present

## 2019-08-24 DIAGNOSIS — C3411 Malignant neoplasm of upper lobe, right bronchus or lung: Secondary | ICD-10-CM | POA: Diagnosis present

## 2019-08-24 NOTE — Patient Instructions (Signed)
Coronavirus (COVID-19) Are you at risk?  Are you at risk for the Coronavirus (COVID-19)?  To be considered HIGH RISK for Coronavirus (COVID-19), you have to meet the following criteria:  . Traveled to [place], [place], [place], [place] or [place]; or in the [place] to [place], [place], [place], or [place]; and have fever, cough, and shortness of breath within the last 2 weeks of travel OR . Been in close contact with a person diagnosed with COVID-19 within the last 2 weeks and have fever, cough, and shortness of breath . IF YOU DO NOT MEET THESE CRITERIA, YOU ARE CONSIDERED LOW RISK FOR COVID-19.  What to do if you are HIGH RISK for COVID-19?  . If you are having a medical emergency, call 911. . Seek medical care right away. Before you go to a doctor's office, urgent care or emergency department, call ahead and tell them about your recent travel, contact with someone diagnosed with COVID-19, and your symptoms. You should receive instructions from your physician's office regarding next steps of care.  . When you arrive at healthcare provider, tell the healthcare staff immediately you have returned from visiting [place], [place], [place], [place] or [place]; or traveled in the [place] to [place], [place], [place], or [place]; in the last two weeks or you have been in close contact with a person diagnosed with COVID-19 in the last 2 weeks.   . Tell the health care staff about your symptoms: fever, cough and shortness of breath. . After you have been seen by a medical provider, you will be either: o Tested for (COVID-19) and discharged home on quarantine except to seek medical care if symptoms worsen, and asked to  - Stay home and avoid contact with others until you get your results (4-5 days)  - Avoid travel on public transportation if possible (such as bus, train, or airplane) or o Sent to the Emergency Department by EMS for evaluation, COVID-19 testing, and possible  admission depending on your condition and test results.  What to do if you are LOW RISK for COVID-19?  Reduce your risk of any infection by using the same precautions used for avoiding the common cold or flu:  . Wash your hands often with soap and warm water for at least 20 seconds.  If soap and water are not readily available, use an alcohol-based hand sanitizer with at least 60% alcohol.  . If coughing or sneezing, cover your mouth and nose by coughing or sneezing into the elbow areas of your shirt or coat, into a tissue or into your sleeve (not your hands). . Avoid shaking hands with others and consider head nods or verbal greetings only. . Avoid touching your eyes, nose, or mouth with unwashed hands.  . Avoid close contact with people who are sick. . Avoid places or events with large numbers of people in one location, like concerts or sporting events. . Carefully consider travel plans you have or are making. . If you are planning any travel outside or inside the [place], visit the CDC's Travelers' Health webpage for the latest health notices. . If you have some symptoms but not all symptoms, continue to monitor at home and seek medical attention if your symptoms worsen. . If you are having a medical emergency, call 911.   ADDITIONAL HEALTHCARE OPTIONS FOR PATIENTS  [facility] / e-Visit: [email or web address]         [facility]: [phone]     Urgent Care: [phone]                   [facility]: [phone]   

## 2019-08-24 NOTE — Progress Notes (Signed)
Pt presents today for f/u and CT results with Dr. Sondra Come. Pt reports cough and runny nose from seasonal allergies. Denies hemoptysis. Pt reports SOB, worse with exertion and is attributed to COPD. Pt denies difficulty swallowing.  BP 138/62 (BP Location: Left Arm, Patient Position: Sitting, Cuff Size: Normal)   Pulse 68   Temp 98.3 F (36.8 C)   Resp 20   Ht 5' (1.524 m)   Wt 111 lb 6.4 oz (50.5 kg)   SpO2 99% Comment: 2.5 lt o2  BMI 21.76 kg/m   Wt Readings from Last 3 Encounters:  08/24/19 111 lb 6.4 oz (50.5 kg)  03/27/19 114 lb 9.6 oz (52 kg)  03/21/18 110 lb 3.2 oz (50 kg)   Loma Sousa, RN BSN

## 2019-08-24 NOTE — Progress Notes (Signed)
Radiation Oncology         (336) [phone] ________________________________  Name: Jenny Howe MRN: [id number]  Date: 08/24/2019  DOB: Dec 29, 1945  Follow-Up Visit Note  CC: Vickie Epley, MD  [name], MD    ICD-10-CM   1. Malignant neoplasm of upper lobe of right lung (HCC)  C34.11   2. 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 Patient had a CT of chest and is here for review Patient reports no change in her breathing.  She continues on supplemental oxygen.  She denies any pain in the chest area or hemoptysis.  Remains on 2-1/2 L of oxygen.  She denies any recent admissions to the hospital for acute exacerbation of her COPD.                          ALLERGIES:  is allergic to penicillins; pneumococcal vaccine; procaine; codeine; ivp dye [iodinated diagnostic agents]; and povidone-iodine [povidone iodine].  Meds: Current Outpatient Medications  Medication Sig Dispense Refill  . albuterol (PROVENTIL) (2.5 MG/3ML) 0.083% nebulizer solution Take 2.5 mg by nebulization every 6 (six) hours as needed for wheezing.    Marland Kitchen albuterol (VENTOLIN HFA) 108 (90 Base) MCG/ACT inhaler Inhale 2 puffs into the lungs daily as needed for wheezing. 6.7 g 3  . arformoterol (BROVANA) 15 MCG/2ML NEBU     . aspirin EC 81 MG tablet     . atorvastatin (LIPITOR) 40 MG tablet Take 40 mg by mouth daily.    Marland Kitchen azelastine (ASTELIN) 137 MCG/SPRAY nasal spray Place 2 sprays into the nose daily. Use in each nostril as directed    . budesonide (PULMICORT) 0.5 MG/2ML nebulizer solution Take 0.5 mg by nebulization 2  (two) times daily.    . celecoxib (CELEBREX) 200 MG capsule Take 200 mg by mouth. Reported on 08/22/2015    . cetirizine (ZYRTEC) 10 MG tablet Take 10 mg by mouth daily.    . Cholecalciferol (VITAMIN D) 2000 UNITS CAPS Take by mouth daily.    Marland Kitchen gabapentin (NEURONTIN) 600 MG tablet Take 600 mg by mouth 2 (two) times daily.    Javier Docker Oil 500 MG CAPS Take by mouth daily.    Marland Kitchen levothyroxine (SYNTHROID, LEVOTHROID) 112 MCG tablet Take 112 mcg by mouth daily before breakfast.    . meclizine (ANTIVERT) 25 MG tablet Take 25 mg by mouth 3 (three) times daily.    Marland Kitchen morphine (MS CONTIN) 30 MG 12 hr tablet Take 1 tablet (30 mg total) by mouth every 12 (twelve) hours. 30 tablet 0  . Multiple Vitamin (MULTIVITAMIN) tablet Take 1 tablet by mouth daily.    . ondansetron (ZOFRAN-ODT) 8 MG disintegrating tablet DIS ONE T PO Q 8 H PRN UTD    . oxyCODONE-acetaminophen (PERCOCET) 10-325 MG tablet Take 1 tablet by mouth every 4 (four) hours as needed for pain. 30 tablet 0  . promethazine (PHENERGAN) 25 MG tablet Take 1 tablet (25 mg total) by mouth every 6 (six) hours as needed for nausea or vomiting. 30 tablet 0  . SPIRIVA RESPIMAT 2.5 MCG/ACT AERS  0  . TRELEGY ELLIPTA 100-62.5-25 MCG/INH AEPB INL 1 PUFF PO AT THE SAME TIME QD  11  . triamcinolone cream (KENALOG) 0.1 % Apply topically.    . vitamin C (ASCORBIC ACID) 500 MG tablet Take 500 mg by mouth daily.    . vitamin E 1000 UNIT capsule Take 1,000 Units by mouth daily.     No current facility-administered medications for this encounter.    Physical Findings: The patient is in no acute distress. Patient is alert and oriented.  height is 5' (1.524 m) and weight is 111 lb 6.4 oz (50.5 kg). Her temperature is 98.3 F (36.8 C). Her blood pressure is 138/62 and her pulse is 68. Her respiration is 20 and oxygen saturation is 99%. . Lungs are clear to auscultation bilaterally. Heart has regular rate and rhythm. No palpable cervical, supraclavicular, or axillary  adenopathy. Abdomen soft, non-tender, normal bowel sounds.  Lab Findings: No results found for: WBC, HGB, HCT, MCV, PLT  Radiographic Findings: CT Chest Wo Contrast  Result Date: 08/22/2019 CLINICAL DATA:  Non-small-cell lung cancer. Restaging. EXAM: CT CHEST WITHOUT CONTRAST TECHNIQUE: Multidetector CT imaging of the chest was performed following the standard protocol without IV contrast. COMPARISON:  03/24/2019 FINDINGS: Cardiovascular: The heart size is normal. No substantial pericardial effusion. Coronary artery calcification is evident. Atherosclerotic calcification is noted in the wall of the thoracic aorta. Enlargement of the pulmonary outflow tract and main pulmonary arteries suggests pulmonary arterial hypertension. Mediastinum/Nodes: Stable appearance of the borderline mediastinal lymphadenopathy. Index precarinal lymph node measured on prior studies is stable at 11 mm. No evidence for gross hilar lymphadenopathy although assessment is limited by the lack of intravenous contrast on today's study. The esophagus has normal imaging features. There is no axillary lymphadenopathy. Lungs/Pleura: Centrilobular emphsyema noted. Post treatment scarring in the parahilar right lung is stable. Multiple other areas of architectural distortion and scarring are evident in the lungs bilaterally. Spiculated nodular opacity in the central right lower lobe has decreased in the interval measuring 1.3 x 1.1 cm today compared to 2.2 x 1.7 cm previously. No new suspicious nodule or mass. No focal airspace consolidation. No pleural effusion. Upper Abdomen: Unremarkable. Musculoskeletal: No worrisome lytic or sclerotic osseous abnormality. Degenerative changes noted right shoulder midthoracic vertebral augmentation again noted with stable appearance T9 compression deformity. IMPRESSION: 1. Continued further decrease in size of the spiculated right lower lobe pulmonary nodule. Otherwise stable appearance of the lungs with  post treatment scarring in the right parahilar lung and multiple regions of architectural distortion/scarring bilaterally. 2. Stable borderline mediastinal lymphadenopathy. 3. Enlargement of the pulmonary outflow tract and main pulmonary arteries suggests pulmonary arterial hypertension. 4. Aortic Atherosclerosis (ICD10-I70.0) and Emphysema (ICD10-J43.9). Electronically Signed   By: Misty Stanley M.D.   On: 08/22/2019 16:14    Impression: Favorable response to recent course of radiation therapy.  No new problems on recent chest CT scan.  CT scan report palpate so the patient can give this to her pulmonologist where she lives.  Plan: Patient will be scheduled for a chest CT scan in 6 months with follow-up soon afterward.  ____________________________________ Gery Pray, MD

## 2019-11-28 ENCOUNTER — Other Ambulatory Visit: Payer: Self-pay | Admitting: Radiation Oncology

## 2019-11-28 MED ORDER — PROMETHAZINE HCL 25 MG PO TABS: 25.0000 mg | ORAL_TABLET | Freq: Four times a day (QID) | ORAL | 0 refills | Status: DC | PRN

## 2020-01-09 ENCOUNTER — Telehealth: Payer: Self-pay | Admitting: *Deleted

## 2020-01-09 NOTE — Telephone Encounter (Signed)
RETURNED PATIENT'S PHONE CALL, SPOKE WITH PATIENT. ?

## 2020-02-05 ENCOUNTER — Other Ambulatory Visit: Payer: Self-pay | Admitting: Radiation Oncology

## 2020-02-05 MED ORDER — PROMETHAZINE HCL 25 MG PO TABS: 25.0000 mg | ORAL_TABLET | Freq: Four times a day (QID) | ORAL | 2 refills | Status: AC | PRN

## 2020-02-22 ENCOUNTER — Telehealth: Payer: Self-pay | Admitting: *Deleted

## 2020-02-22 NOTE — Telephone Encounter (Signed)
CALLED PATIENT'S DAUGHTER [name] RAY TO ALTER FU APPT. ON 02-26-20 DUE TO DR. KINARD BEING IN THE OR, RESCHEDULED FOR 03-11-20, BUT PATIENT'S DAUGHTER [name] RAY ASKED ME TO CANCEL THIS APPT. DUE TO HER MOM BEING IN AND OUT OF THE HOSPITAL, PATIENT'S DAUGHTER [name] RAY WILL CALL BACK AND RESCHEDULE THIS APPT.

## 2020-02-26 ENCOUNTER — Ambulatory Visit: Payer: Medicare Other | Admitting: Radiation Oncology

## 2020-03-11 ENCOUNTER — Ambulatory Visit: Payer: Medicare Other | Admitting: Radiation Oncology

## 2020-03-21 ENCOUNTER — Ambulatory Visit: Payer: Medicare PPO | Admitting: Radiation Oncology

## 2020-04-17 ENCOUNTER — Telehealth: Payer: Self-pay | Admitting: Radiation Oncology

## 2020-04-17 NOTE — Telephone Encounter (Signed)
Ms. Jenny Howe called me on Monday, 11/22 to ask if her CT scan results from 12/1 could be a telephone call and not another in-person appt due to living 4 hours away. Dr. Sondra Come sent me an in basket msg to state that he would like to see her right after her CT scan on 12/1 and that he will still call her on 12/2 with results. This was agreeable to the pt.

## 2020-04-24 ENCOUNTER — Telehealth: Payer: Self-pay | Admitting: *Deleted

## 2020-04-24 ENCOUNTER — Ambulatory Visit (HOSPITAL_COMMUNITY): Payer: Medicare PPO

## 2020-04-24 ENCOUNTER — Ambulatory Visit
Admission: RE | Admit: 2020-04-24 | Discharge: 2020-04-24 | Disposition: A | Discharge status: Home / Self Care | Payer: Medicare PPO | Source: Ambulatory Visit | Attending: Radiation Oncology | Admitting: Radiation Oncology

## 2020-04-24 DIAGNOSIS — C3411 Malignant neoplasm of upper lobe, right bronchus or lung: Secondary | ICD-10-CM

## 2020-04-24 NOTE — Telephone Encounter (Signed)
CALLED PATIENT TO INFORM THAT CT HAS BEEN RESCHEDULED FOR 05-07-20 - ARRIVAL TIME- 3:45 PM @ [facility], NO RESTRICTIONS TO TEST, PATIENT TO RECEIVE RESULTS FROM 12-16- 21 @ 11:30 AM WITH DR. KINARD, PATIENT VERIFIED UNDERSTANDING THESE APPTS.

## 2020-04-25 ENCOUNTER — Telehealth: Payer: Self-pay | Admitting: *Deleted

## 2020-04-25 ENCOUNTER — Ambulatory Visit: Payer: Medicare PPO | Admitting: Radiation Oncology

## 2020-04-25 NOTE — Telephone Encounter (Signed)
CALLED PATIENT TO ASK IF SHE WOULD BE WILLING TO COME ON 05-07-20 @ 3 PM , SO THAT DR. KINARD CAN EXAM HER, LVM FOR A RETURN CALL

## 2020-05-06 ENCOUNTER — Telehealth: Payer: Self-pay | Admitting: *Deleted

## 2020-05-06 NOTE — Telephone Encounter (Signed)
Called patient's daughter- Alyssa Grove to alter fu for 05-09-20 to 05-07-20, therefore Dr. Sondra Come can exam patient and then she can have scan, patient's daughter informed me that her mom is now in [facility], and she had me cancel all appts. for mom and she will call me when her mom is ready to be rescheduled

## 2020-05-07 ENCOUNTER — Ambulatory Visit (HOSPITAL_COMMUNITY): Payer: Medicare PPO

## 2020-05-09 ENCOUNTER — Ambulatory Visit: Payer: Medicare PPO | Admitting: Radiation Oncology

## 2020-06-05 ENCOUNTER — Telehealth: Payer: Self-pay | Admitting: *Deleted

## 2020-06-05 NOTE — Telephone Encounter (Signed)
Returned patient's phone call, unable to leave message due to mailbox being full

## 2020-06-06 ENCOUNTER — Other Ambulatory Visit: Payer: Self-pay | Admitting: Radiation Oncology

## 2020-06-06 DIAGNOSIS — R911 Solitary pulmonary nodule: Secondary | ICD-10-CM

## 2020-06-07 ENCOUNTER — Telehealth: Payer: Self-pay | Admitting: *Deleted

## 2020-06-07 NOTE — Telephone Encounter (Signed)
CALLED PATIENT'S DAUGHTER, [name] RAY TO INFORM OF CT FOR 06/20/20- ARRIVAL TIME- 11:15 AM @ [facility], NO RESTRICTIONS TO TEST, PATIENT TO FU  WITH DR. KINARD ON 06-20-20 @ 4:30 PM FOR RESULTS, PER DR. KINARD, REQUEST, UNABLE TO LEAVE MESSAGE, VM FULL WILL CALL LATER

## 2020-06-07 NOTE — Telephone Encounter (Signed)
XXXX 

## 2020-06-19 ENCOUNTER — Telehealth: Payer: Self-pay | Admitting: *Deleted

## 2020-06-19 NOTE — Telephone Encounter (Signed)
CALLED PATIENT TO INFORM THAT SCAN HAS BEEN MOVED TO 07-15-20 - ARRIVAL TIME- 1:15 PM @ [facility], NO RESTRICTIONS TO TEST AND DR. KINARD WILL FU WITH THIS PATIENT ON 07-15-20 @ 4:30 PM FOR RESULTS, LVM FOR A RETURN CALL

## 2020-06-20 ENCOUNTER — Ambulatory Visit: Admission: RE | Admit: 2020-06-20 | Payer: Medicare PPO | Source: Ambulatory Visit | Admitting: Radiation Oncology

## 2020-06-20 ENCOUNTER — Ambulatory Visit (HOSPITAL_COMMUNITY): Payer: Medicare PPO

## 2020-07-11 ENCOUNTER — Telehealth: Payer: Self-pay | Admitting: *Deleted

## 2020-07-11 NOTE — Telephone Encounter (Signed)
Called patient's daughter- Jenny Howe to ask about doing her CT @ another location, due to her insurance declining to let her have it done @ [facility], spoke with patient's daughter- Jenny Howe and she will call me back and let me know where to schedule it at.

## 2020-07-15 ENCOUNTER — Ambulatory Visit (HOSPITAL_COMMUNITY): Payer: Medicare HMO

## 2020-07-15 ENCOUNTER — Encounter (HOSPITAL_COMMUNITY): Payer: Self-pay

## 2020-07-15 ENCOUNTER — Ambulatory Visit: Payer: Medicare PPO | Admitting: Radiation Oncology

## 2020-07-18 ENCOUNTER — Telehealth: Payer: Self-pay | Admitting: *Deleted

## 2020-07-18 NOTE — Telephone Encounter (Signed)
CALLED PATIENT TO INFORM OF CT FOR 07-30-20 - ARRIVAL TIME - 1:45 PM @ [facility] AND  HER FU APPT. WITH DR. Glen Raven ON 08-05-20 @ 4:20 PM, SPOKE WITH PATIENT AND SHE VERIFIED UNDERSTANDING THESE APPT.

## 2020-07-30 ENCOUNTER — Telehealth: Payer: Self-pay | Admitting: *Deleted

## 2020-07-30 NOTE — Telephone Encounter (Signed)
Returned patient's phone call, lvm for a return call 

## 2020-08-01 ENCOUNTER — Encounter: Payer: Self-pay | Admitting: *Deleted

## 2020-08-04 NOTE — Progress Notes (Incomplete)
Radiation Oncology         [phone]) [phone] ________________________________  Name: Jenny Howe MRN: [id number]  Date: 08/05/2020  DOB: [birth year]  Follow-Up Visit Note  CC: Vickie Epley, MD  [name], MD  No diagnosis found.  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 Chest CT scan on 07/30/2020 showed mediastinal adenopathy with precarinal nodes that measured up to 16 x 14 mm in size. The lymph nodes are unchanged. A small to moderate right pleural effusion was persistent but smaller. There was no left pleural nor pericardial fluid present. There was no axillary adenopathy.  On review of systems, she reports ***. She denies ***.  ALLERGIES:  is allergic to penicillins, pneumococcal vaccine, procaine, codeine, ivp dye [iodinated diagnostic agents], and povidone-iodine [povidone iodine].  Meds: Current Outpatient Medications  Medication Sig Dispense Refill  . albuterol (PROVENTIL) (2.5 MG/3ML) 0.083% nebulizer solution Take 2.5 mg by nebulization every 6 (six) hours as needed for wheezing.    Marland Kitchen albuterol (VENTOLIN HFA) 108 (90 Base) MCG/ACT inhaler Inhale 2 puffs into the lungs daily as needed for wheezing. 6.7 g 3  . arformoterol (BROVANA) 15 MCG/2ML NEBU     . aspirin EC 81 MG tablet     . atorvastatin (LIPITOR) 40 MG tablet Take 40 mg by mouth daily.    Marland Kitchen azelastine (ASTELIN) 137 MCG/SPRAY nasal spray Place 2 sprays into the nose daily. Use in each nostril as directed    . budesonide (PULMICORT) 0.5 MG/2ML nebulizer solution Take 0.5 mg by nebulization 2 (two) times daily.    . celecoxib (CELEBREX) 200 MG capsule Take 200  mg by mouth. Reported on 08/22/2015    . cetirizine (ZYRTEC) 10 MG tablet Take 10 mg by mouth daily.    . Cholecalciferol (VITAMIN D) 2000 UNITS CAPS Take by mouth daily.    Marland Kitchen gabapentin (NEURONTIN) 600 MG tablet Take 600 mg by mouth 2 (two) times daily.    Javier Docker Oil 500 MG CAPS Take by mouth daily.    Marland Kitchen levothyroxine (SYNTHROID, LEVOTHROID) 112 MCG tablet Take 112 mcg by mouth daily before breakfast.    . meclizine (ANTIVERT) 25 MG tablet Take 25 mg by mouth 3 (three) times daily.    Marland Kitchen morphine (MS CONTIN) 30 MG 12 hr tablet Take 1 tablet (30 mg total) by mouth every 12 (twelve) hours. 30 tablet 0  . Multiple Vitamin (MULTIVITAMIN) tablet Take 1 tablet by mouth daily.    . ondansetron (ZOFRAN-ODT) 8 MG disintegrating tablet DIS ONE T PO Q 8 H PRN UTD    . oxyCODONE-acetaminophen (PERCOCET) 10-325 MG tablet Take 1 tablet by mouth every 4 (four) hours as needed for pain. 30 tablet 0  . promethazine (PHENERGAN) 25 MG tablet Take 1 tablet (25 mg total) by mouth every 6 (six) hours as needed for nausea or vomiting. 30 tablet 2  . SPIRIVA RESPIMAT 2.5 MCG/ACT AERS   0  . TRELEGY ELLIPTA 100-62.5-25 MCG/INH AEPB INL 1 PUFF PO AT THE SAME TIME QD  11  . vitamin C (ASCORBIC ACID) 500 MG tablet Take 500 mg by mouth daily.    . vitamin E 1000 UNIT capsule  Take 1,000 Units by mouth daily.     No current facility-administered medications for this encounter.    Physical Findings: The patient is in no acute distress. Patient is alert and oriented.  vitals were not taken for this visit.  Lungs are clear to auscultation bilaterally. Heart has regular rate and rhythm. No palpable cervical, supraclavicular, or axillary adenopathy. Abdomen soft, non-tender, normal bowel sounds. ***  Lab Findings: No results found for: WBC, HGB, HCT, MCV, PLT  Radiographic Findings: No results found.  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 Recent chest CT scan was stable.  Plan: The patient will follow up with radiation oncology in *** months with a chest CT scan to be performed prior to that visit.  Total time spent in this encounter was *** minutes which included reviewing the patient's most recent chest CT scan, physical examination, and documentation. ____________________________________   Blair Promise, PhD, MD  This document serves as a record of services personally performed by Gery Pray, MD. It was created on his behalf by Clerance Lav, a trained medical scribe. The creation of this record is based on the scribe's personal observations and the provider's statements to them. This document has been checked and approved by the attending provider.

## 2020-08-05 ENCOUNTER — Ambulatory Visit
Admission: RE | Admit: 2020-08-05 | Discharge: 2020-08-05 | Disposition: A | Discharge status: Home / Self Care | Payer: Medicare HMO | Source: Ambulatory Visit | Attending: Radiation Oncology | Admitting: Radiation Oncology

## 2020-09-09 ENCOUNTER — Ambulatory Visit
Admission: RE | Admit: 2020-09-09 | Discharge: 2020-09-09 | Disposition: A | Discharge status: Home / Self Care | Payer: Medicare HMO | Source: Ambulatory Visit | Attending: Radiation Oncology | Admitting: Radiation Oncology

## 2020-09-09 NOTE — Progress Notes (Incomplete)
Radiation Oncology         [phone]) [phone] ________________________________  Name: Jenny Howe MRN: [id number]  Date: 09/09/2020  DOB: 1945-10-21  Follow-Up Visit Note  CC: Vickie Epley, MD  [name], MD  No diagnosis found.  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 Chest CT scan on 07/30/2020 showed mediastinal adenopathy with precarinal nodes that measured up to 16 x 14 mm in size. The lymph nodes are unchanged. A small to moderate right pleural effusion was persistent but smaller. There was no left pleural nor pericardial fluid present. There was no axillary adenopathy.  On review of systems, she reports ***. She denies ***.  ALLERGIES:  is allergic to penicillins, pneumococcal vaccine, procaine, codeine, ivp dye [iodinated diagnostic agents], and povidone-iodine [povidone iodine].  Meds: Current Outpatient Medications  Medication Sig Dispense Refill  . albuterol (PROVENTIL) (2.5 MG/3ML) 0.083% nebulizer solution Take 2.5 mg by nebulization every 6 (six) hours as needed for wheezing.    Marland Kitchen albuterol (VENTOLIN HFA) 108 (90 Base) MCG/ACT inhaler Inhale 2 puffs into the lungs daily as needed for wheezing. 6.7 g 3  . arformoterol (BROVANA) 15 MCG/2ML NEBU     . aspirin EC 81 MG tablet     . atorvastatin (LIPITOR) 40 MG tablet Take 40 mg by mouth daily.    Marland Kitchen azelastine (ASTELIN) 137 MCG/SPRAY nasal spray Place 2 sprays into the nose daily. Use in each nostril as directed    . budesonide (PULMICORT) 0.5 MG/2ML nebulizer solution Take 0.5 mg by nebulization 2 (two) times daily.    . celecoxib (CELEBREX) 200 MG capsule Take 200  mg by mouth. Reported on 08/22/2015    . cetirizine (ZYRTEC) 10 MG tablet Take 10 mg by mouth daily.    . Cholecalciferol (VITAMIN D) 2000 UNITS CAPS Take by mouth daily.    Marland Kitchen gabapentin (NEURONTIN) 600 MG tablet Take 600 mg by mouth 2 (two) times daily.    Javier Docker Oil 500 MG CAPS Take by mouth daily.    Marland Kitchen levothyroxine (SYNTHROID, LEVOTHROID) 112 MCG tablet Take 112 mcg by mouth daily before breakfast.    . meclizine (ANTIVERT) 25 MG tablet Take 25 mg by mouth 3 (three) times daily.    Marland Kitchen morphine (MS CONTIN) 30 MG 12 hr tablet Take 1 tablet (30 mg total) by mouth every 12 (twelve) hours. 30 tablet 0  . Multiple Vitamin (MULTIVITAMIN) tablet Take 1 tablet by mouth daily.    . ondansetron (ZOFRAN-ODT) 8 MG disintegrating tablet DIS ONE T PO Q 8 H PRN UTD    . oxyCODONE-acetaminophen (PERCOCET) 10-325 MG tablet Take 1 tablet by mouth every 4 (four) hours as needed for pain. 30 tablet 0  . promethazine (PHENERGAN) 25 MG tablet Take 1 tablet (25 mg total) by mouth every 6 (six) hours as needed for nausea or vomiting. 30 tablet 2  . SPIRIVA RESPIMAT 2.5 MCG/ACT AERS   0  . TRELEGY ELLIPTA 100-62.5-25 MCG/INH AEPB INL 1 PUFF PO AT THE SAME TIME QD  11  . vitamin C (ASCORBIC ACID) 500 MG tablet Take 500 mg by mouth daily.    . vitamin E 1000 UNIT capsule  Take 1,000 Units by mouth daily.     No current facility-administered medications for this encounter.    Physical Findings: The patient is in no acute distress. Patient is alert and oriented.  vitals were not taken for this visit.  Lungs are clear to auscultation bilaterally. Heart has regular rate and rhythm. No palpable cervical, supraclavicular, or axillary adenopathy. Abdomen soft, non-tender, normal bowel sounds. ***  Lab Findings: No results found for: WBC, HGB, HCT, MCV, PLT  Radiographic Findings: No results found.  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 Recent chest CT scan was stable.  Plan: The patient will follow up with radiation oncology in *** months with a chest CT scan to be performed prior to that visit.  Total time spent in this encounter was *** minutes which included reviewing the patient's most recent chest CT scan, physical examination, and documentation. ____________________________________   Blair Promise, PhD, MD  This document serves as a record of services personally performed by Gery Pray, MD. It was created on his behalf by Clerance Lav, a trained medical scribe. The creation of this record is based on the scribe's personal observations and the provider's statements to them. This document has been checked and approved by the attending provider.

## 2020-09-10 ENCOUNTER — Telehealth: Payer: Self-pay | Admitting: *Deleted

## 2020-09-10 NOTE — Telephone Encounter (Signed)
CALLED PATIENT TO ASK ABOUT RESCHEDULING MISSED FU FOR 09-09-20, SPOKE WITH DAUGHTER TRINIA AND SHE STATES THAT HER MOM IS IN THE HOSPITAL AND WHEN SHE IS READY TO COME BACK , SHE WILL GIVE ME A CALL

## 2020-10-23 DEATH — deceased

## 2021-02-24 ENCOUNTER — Ambulatory Visit: Payer: Self-pay | Admitting: Radiation Oncology

## 2022-03-15 IMAGING — CT CT CHEST W/O CM
2 of 4 series · 15 of 36 positions shown, 18 images · non-contrast
Comparison: 03/24/2019

CLINICAL DATA: Non-small-cell lung cancer. Restaging.

EXAM:
CT CHEST WITHOUT CONTRAST
TECHNIQUE: Multidetector CT imaging of the chest was performed following the
standard protocol without IV contrast.

[Series 2: thorax · axial · 0.64mm/px · z∈[+92,+322]mm · 12 of 137 slices shown, 15 images]
[im 11/137  mediastinal]
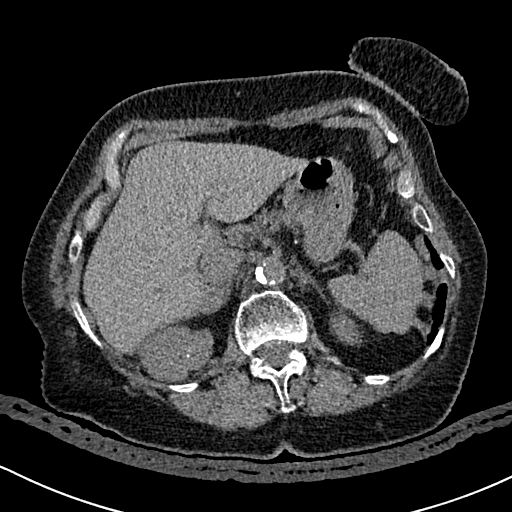
[im 11/137  lung]
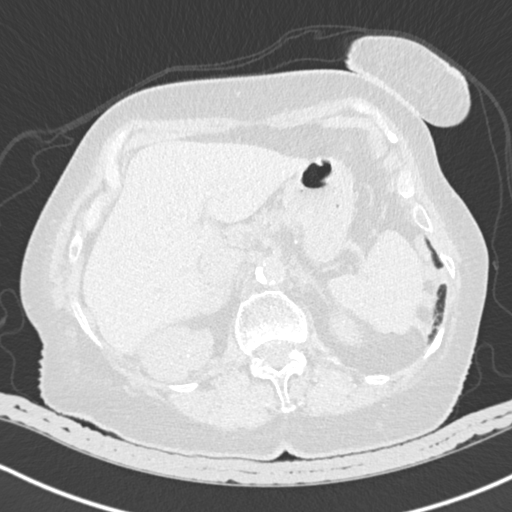
[im 21/137  lung]
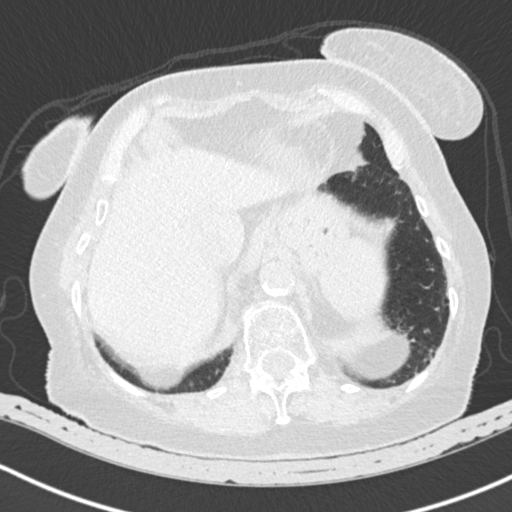
[im 32/137  lung]
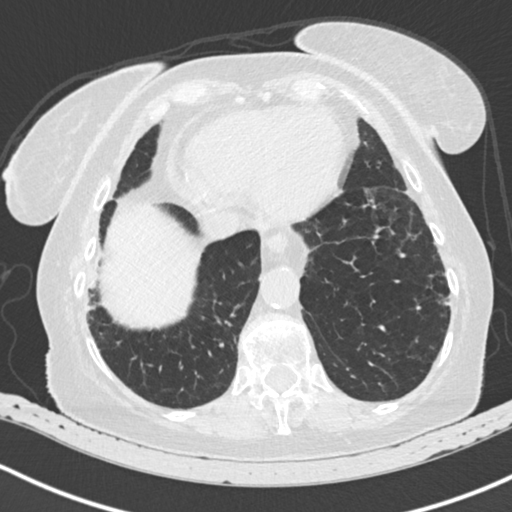
[im 42/137  lung]
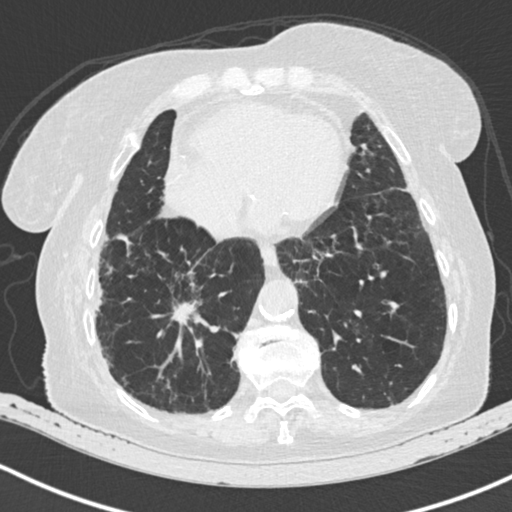
[im 53/137  mediastinal]
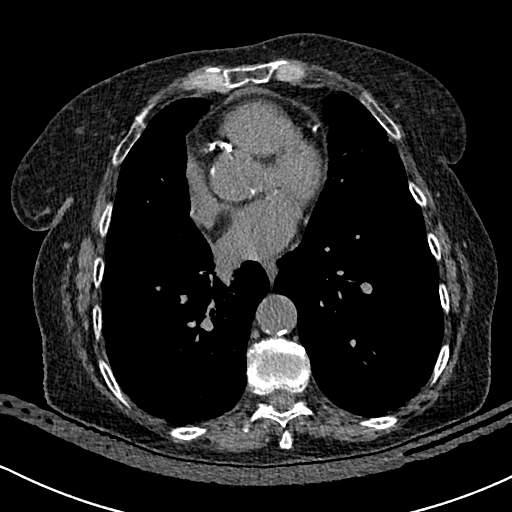
[im 53/137  lung]
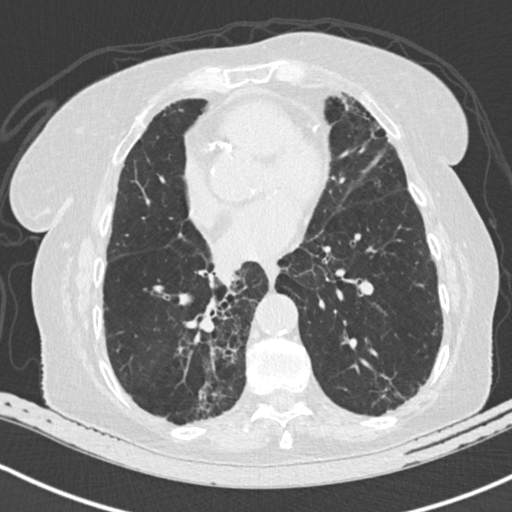
[im 63/137  lung]
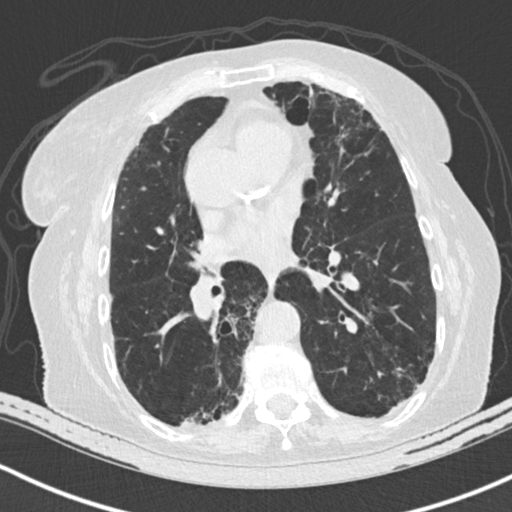
[im 74/137  lung]
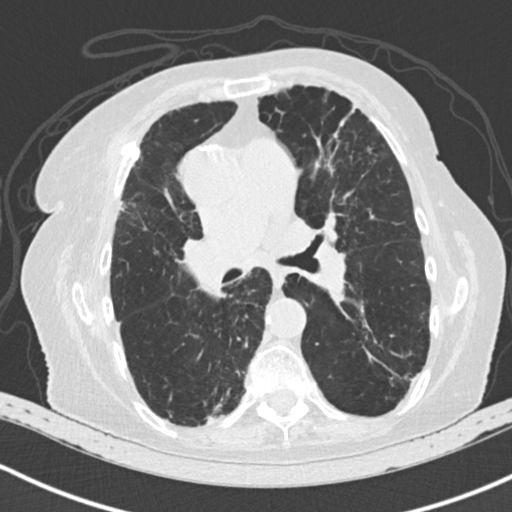
[im 84/137  lung]
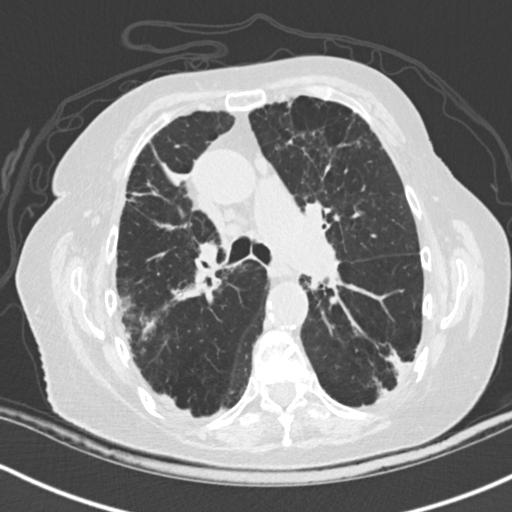
[im 95/137  mediastinal]
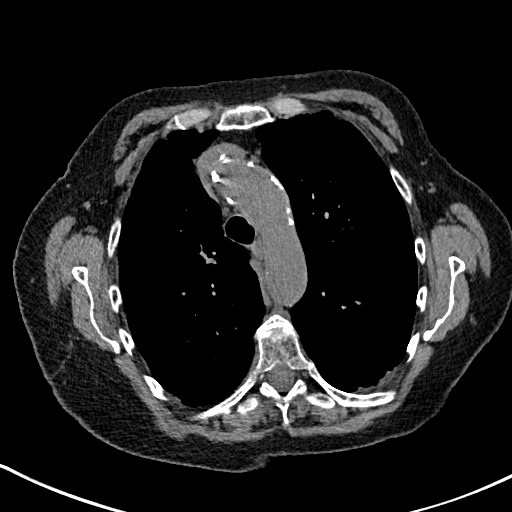
[im 95/137  lung]
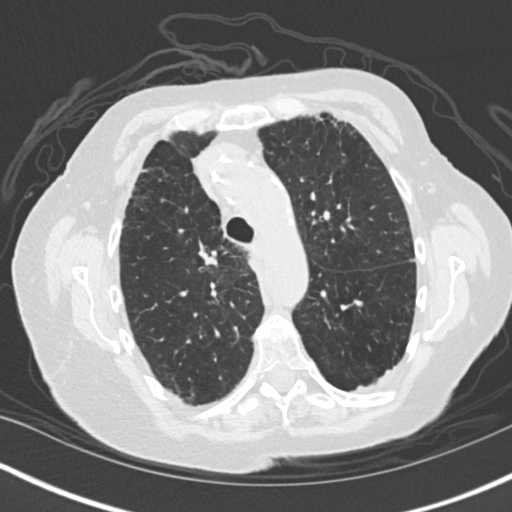
[im 105/137  lung]
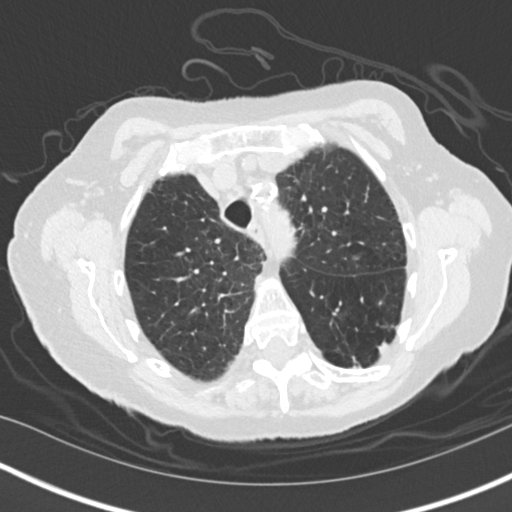
[im 116/137  lung]
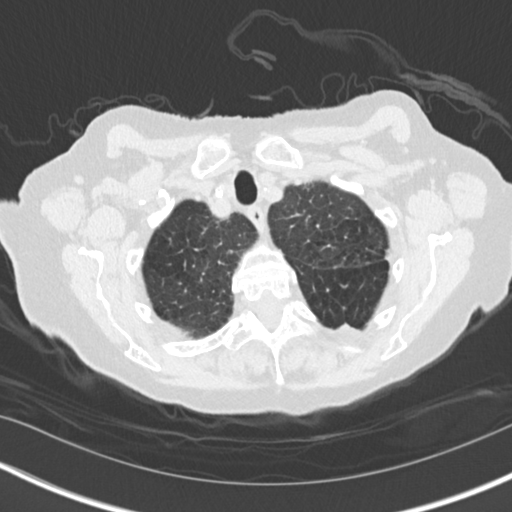
[im 126/137  lung]
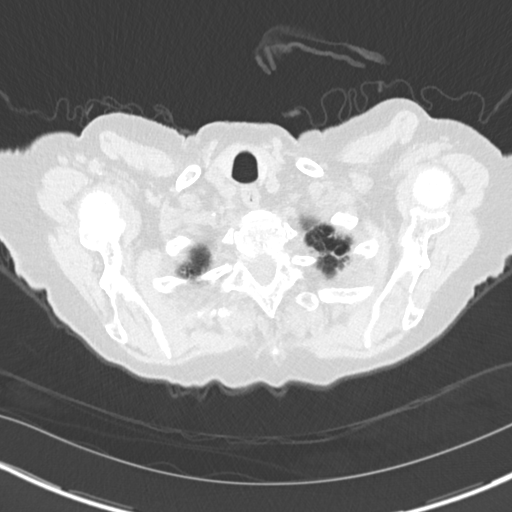

[Series 5: coronal · coronal · 0.54mm/px · 3 of 144 slices shown]
[im 29/144  lung]
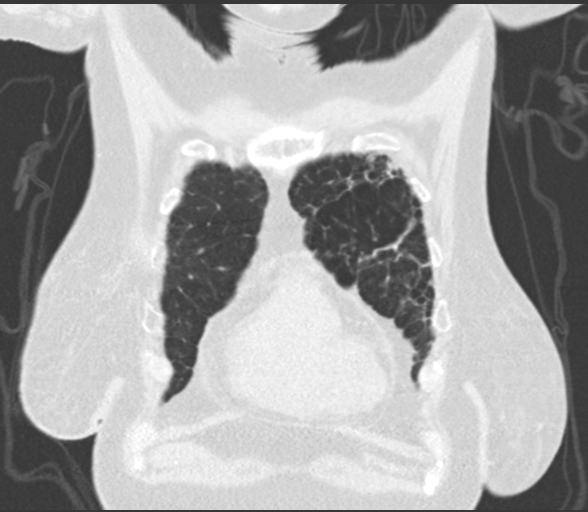
[im 58/144  lung]
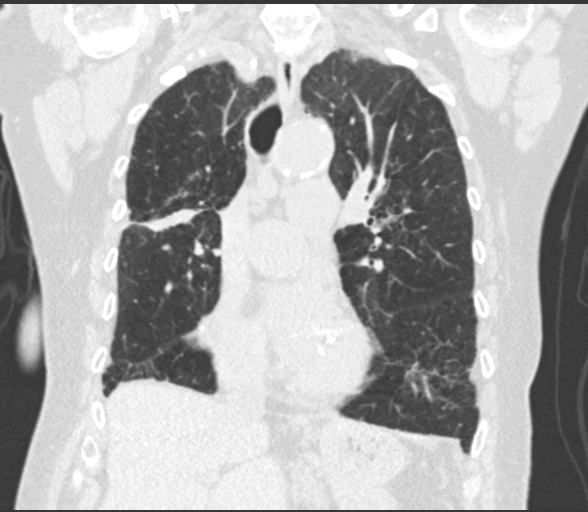
[im 86/144  lung]
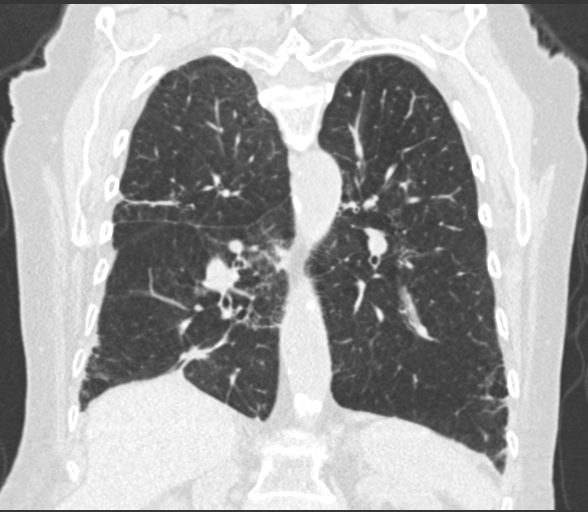

[15 of 36 positions shown; findings below may reference images not displayed]

FINDINGS: Cardiovascular: The heart size is normal. No substantial pericardial
effusion. Coronary artery calcification is evident. Atherosclerotic
calcification is noted in the wall of the thoracic aorta.
Enlargement of the pulmonary outflow tract and main pulmonary
arteries suggests pulmonary arterial hypertension.

Mediastinum/Nodes: Stable appearance of the borderline mediastinal
lymphadenopathy. Index precarinal lymph node measured on prior
studies is stable at 11 mm. No evidence for gross hilar
lymphadenopathy although assessment is limited by the lack of
intravenous contrast on today's study. The esophagus has normal
imaging features. There is no axillary lymphadenopathy.

Lungs/Pleura: Centrilobular emphsyema noted. Post treatment scarring
in the parahilar right lung is stable. Multiple other areas of
architectural distortion and scarring are evident in the lungs
bilaterally. Spiculated nodular opacity in the central right lower
lobe has decreased in the interval measuring 1.3 x 1.1 cm today
compared to 2.2 x 1.7 cm previously. No new suspicious nodule or
mass. No focal airspace consolidation. No pleural effusion.

Upper Abdomen: Unremarkable.

Musculoskeletal: No worrisome lytic or sclerotic osseous
abnormality. Degenerative changes noted right shoulder midthoracic
vertebral augmentation again noted with stable appearance T9
compression deformity.
IMPRESSION: 1. Continued further decrease in size of the spiculated right lower
lobe pulmonary nodule. Otherwise stable appearance of the lungs with
post treatment scarring in the right parahilar lung and multiple
regions of architectural distortion/scarring bilaterally.
2. Stable borderline mediastinal lymphadenopathy.
3. Enlargement of the pulmonary outflow tract and main pulmonary
arteries suggests pulmonary arterial hypertension.
4. Aortic Atherosclerosis ([id number].F) and Emphysema ([id number].5).

## 2022-10-28 ENCOUNTER — Other Ambulatory Visit: Payer: Self-pay | Admitting: Podiatry
